# Patient Record
Sex: Male | Born: 1950 | Race: White | Hispanic: No | Smoking: Never smoker
Health system: Southern US, Community
[De-identification: ages and names within clinical notes are randomized; demographics above are authoritative.]

## PROBLEM LIST (undated history)

## (undated) DIAGNOSIS — K5100 Ulcerative (chronic) pancolitis without complications: Secondary | ICD-10-CM

## (undated) DIAGNOSIS — K529 Noninfective gastroenteritis and colitis, unspecified: Secondary | ICD-10-CM

## (undated) DIAGNOSIS — K2270 Barrett's esophagus without dysplasia: Secondary | ICD-10-CM

## (undated) DIAGNOSIS — K219 Gastro-esophageal reflux disease without esophagitis: Secondary | ICD-10-CM

## (undated) DIAGNOSIS — M1990 Unspecified osteoarthritis, unspecified site: Secondary | ICD-10-CM

## (undated) DIAGNOSIS — I10 Essential (primary) hypertension: Secondary | ICD-10-CM

## (undated) HISTORY — DX: Ulcerative (chronic) pancolitis without complications: K51.00

## (undated) HISTORY — DX: Noninfective gastroenteritis and colitis, unspecified: K52.9

## (undated) HISTORY — DX: Barrett's esophagus without dysplasia: K22.70

## (undated) HISTORY — DX: Unspecified osteoarthritis, unspecified site: M19.90

## (undated) HISTORY — DX: Gastro-esophageal reflux disease without esophagitis: K21.9

## (undated) HISTORY — DX: Essential (primary) hypertension: I10

---

## 2010-10-26 HISTORY — PX: HERNIA REPAIR: SHX51

## 2020-11-14 ENCOUNTER — Other Ambulatory Visit: Payer: Self-pay | Admitting: *Deleted

## 2020-11-14 ENCOUNTER — Telehealth: Payer: Self-pay

## 2020-11-14 NOTE — Telephone Encounter (Signed)
Patient is rescheduled

## 2020-11-14 NOTE — Telephone Encounter (Signed)
Richard Grant is calling in on behalf of her husband, they just moved here from [place] and have scheduled New Patient appointment's for March but are running to an issue with medication. They have tried calling their old doctor's office but because they are out of state they aren't able to get any refills. Richard Grant wants to know if there is any way they can get in before March.

## 2020-11-14 NOTE — Telephone Encounter (Signed)
Ok to change appointment to an early day if we have any openings  We unable  to refill medication till patient establish care with PCP

## 2020-12-05 ENCOUNTER — Encounter: Payer: Self-pay | Admitting: Family Medicine

## 2020-12-05 ENCOUNTER — Other Ambulatory Visit: Payer: Self-pay

## 2020-12-05 ENCOUNTER — Ambulatory Visit (INDEPENDENT_AMBULATORY_CARE_PROVIDER_SITE_OTHER): Payer: Medicare HMO | Admitting: Family Medicine

## 2020-12-05 VITALS — BP 122/74 | HR 87 | Temp 98.3°F | Ht 62.0 in | Wt 123.8 lb

## 2020-12-05 DIAGNOSIS — Z0001 Encounter for general adult medical examination with abnormal findings: Secondary | ICD-10-CM

## 2020-12-05 DIAGNOSIS — Z125 Encounter for screening for malignant neoplasm of prostate: Secondary | ICD-10-CM | POA: Diagnosis not present

## 2020-12-05 DIAGNOSIS — Z1211 Encounter for screening for malignant neoplasm of colon: Secondary | ICD-10-CM | POA: Diagnosis not present

## 2020-12-05 DIAGNOSIS — Z13220 Encounter for screening for lipoid disorders: Secondary | ICD-10-CM

## 2020-12-05 DIAGNOSIS — I10 Essential (primary) hypertension: Secondary | ICD-10-CM

## 2020-12-05 LAB — LIPID PANEL
Cholesterol: 183 mg/dL (ref 0–200)
HDL: 82.7 mg/dL (ref >39.00)
LDL Cholesterol: 84 mg/dL (ref 0–99)
NonHDL: 100.44
Total CHOL/HDL Ratio: 2
Triglycerides: 84 mg/dL (ref 0.0–149.0)
VLDL: 16.8 mg/dL (ref 0.0–40.0)

## 2020-12-05 LAB — COMPREHENSIVE METABOLIC PANEL
ALT: 12 U/L (ref 0–53)
AST: 16 U/L (ref 0–37)
Albumin: 4.1 g/dL (ref 3.5–5.2)
Alkaline Phosphatase: 65 U/L (ref 39–117)
BUN: 13 mg/dL (ref 6–23)
CO2: 30 mEq/L (ref 19–32)
Calcium: 9.6 mg/dL (ref 8.4–10.5)
Chloride: 101 mEq/L (ref 96–112)
Creatinine, Ser: 0.91 mg/dL (ref 0.40–1.50)
GFR: 87.36 mL/min (ref >60.00)
Glucose, Bld: 82 mg/dL (ref 70–99)
Potassium: 3.9 mEq/L (ref 3.5–5.1)
Sodium: 140 mEq/L (ref 135–145)
Total Bilirubin: 0.6 mg/dL (ref 0.2–1.2)
Total Protein: 7.3 g/dL (ref 6.0–8.3)

## 2020-12-05 LAB — CBC
HCT: 42 % (ref 39.0–52.0)
Hemoglobin: 14.3 g/dL (ref 13.0–17.0)
MCHC: 34 g/dL (ref 30.0–36.0)
MCV: 99 fl (ref 78.0–100.0)
Platelets: 405 10*3/uL — ABNORMAL HIGH (ref 150.0–400.0)
RBC: 4.24 Mil/uL (ref 4.22–5.81)
RDW: 13.6 % (ref 11.5–15.5)
WBC: 11.6 10*3/uL — ABNORMAL HIGH (ref 4.0–10.5)

## 2020-12-05 LAB — TSH: TSH: 3.89 u[IU]/mL (ref 0.35–4.50)

## 2020-12-05 LAB — PSA: PSA: 2.06 ng/mL (ref 0.10–4.00)

## 2020-12-05 NOTE — Progress Notes (Signed)
Chief Complaint:  Richard Grant is a 70 y.o. male who presents today for his annual comprehensive physical exam.    Assessment/Plan:  New/Acute Problems: Diarrhea Started a few weeks ago.  He is concerned about IBS.  He has been under more stress recently due to recent move.  No reported abdominal pain.  Reassuring abdominal exam today.  No reported melena or hematochezia.  Recommend he try peppermint oil.  He will let me know if not proving the next  Chronic Problems Addressed Today: Essential hypertension At goal.  Continue HCTZ 25 mg daily metoprolol 50 mg daily.  Obtain records from previous PCP.  Recommended home monitoring goal 140/90 or lower.  Discussed lifestyle interventions.  Check labs today.  Preventative Healthcare: Has received 1 pneumonia vaccine but is not sure which.  Will obtain records from previous PCP regarding his amount of healthcare and prior vaccines.  Will check labs today.  Also place order for Cologuard.  Patient Counseling(The following topics were reviewed and/or handout was given):  -Nutrition: Stressed importance of moderation in sodium/caffeine intake, saturated fat and cholesterol, caloric balance, sufficient intake of fresh fruits, vegetables, and fiber.  -Stressed the importance of regular exercise.   -Substance Abuse: Discussed cessation/primary prevention of tobacco, alcohol, or other drug use; driving or other dangerous activities under the influence; availability of treatment for abuse.   -Injury prevention: Discussed safety belts, safety helmets, smoke detector, smoking near bedding or upholstery.   -Sexuality: Discussed sexually transmitted diseases, partner selection, use of condoms, avoidance of unintended pregnancy and contraceptive alternatives.   -Dental health: Discussed importance of regular tooth brushing, flossing, and dental visits.  -Health maintenance and immunizations reviewed. Please refer to Health maintenance section.  Return to  care in 1 year for next preventative visit.     Subjective:  HPI:  He has no acute complaints today.   Lifestyle Diet: Balanced.  Exercise: Does a lot of walking.   Depression screen PHQ 2/9 12/05/2020  Decreased Interest 0  Down, Depressed, Hopeless 0  PHQ - 2 Score 0    Health Maintenance Due  Topic Date Due  . Hepatitis C Screening  Never done  . TETANUS/TDAP  Never done  . COLONOSCOPY (Pts 45-32yrs Insurance coverage will need to be confirmed)  Never done  . PNA vac Low Risk Adult (1 of 2 - PCV13) Never done     ROS: Per HPI, otherwise a complete review of systems was negative.   PMH:  The following were reviewed and entered/updated in epic: Past Medical History:  Diagnosis Date  . Arthritis   . Hypertension    Patient Active Problem List   Diagnosis Date Noted  . Essential hypertension 12/05/2020   Past Surgical History:  Procedure Laterality Date  . HERNIA REPAIR  2012    Family History  Problem Relation Age of Onset  . Arthritis Mother   . Hypertension Mother   . Diabetes Father   . Early death Father   . Hypertension Father   . Drug abuse Sister   . Mental illness Sister   . Cancer Maternal Grandfather   . Cancer Paternal Grandfather     Medications- reviewed and updated Current Outpatient Medications  Medication Sig Dispense Refill  . hydrochlorothiazide (HYDRODIURIL) 25 MG tablet Take 25 mg by mouth daily.    . metoprolol tartrate (LOPRESSOR) 50 MG tablet Take 50 mg by mouth daily at 8 pm.    . Milk Thistle 500 MG CAPS Take by mouth.    Marland Kitchen  MULTIPLE VITAMIN PO Take by mouth.    . Saw Palmetto, Serenoa repens, (SAW PALMETTO PO) Take by mouth.     No current facility-administered medications for this visit.    Allergies-reviewed and updated Allergies  Allergen Reactions  . Lactose Intolerance (Gi)   . Latex     Social History   Socioeconomic History  . Marital status: Married    Spouse name: Not on file  . Number of children: Not  on file  . Years of education: Not on file  . Highest education level: Not on file  Occupational History  . Not on file  Tobacco Use  . Smoking status: Never Smoker  . Smokeless tobacco: Not on file  Substance and Sexual Activity  . Alcohol use: Yes    Alcohol/week: 2.0 standard drinks    Types: 2 Glasses of wine per week    Comment: DAILY   . Drug use: Never  . Sexual activity: Not on file  Other Topics Concern  . Not on file  Social History Narrative  . Not on file   Social Determinants of Health   Financial Resource Strain: Not on file  Food Insecurity: Not on file  Transportation Needs: Not on file  Physical Activity: Not on file  Stress: Not on file  Social Connections: Not on file        Objective:  Physical Exam: BP 122/74   Pulse 87   Temp 98.3 F (36.8 C) (Oral)   Ht 5\' 2"  (1.575 m)   Wt 123 lb 12.8 oz (56.2 kg)   SpO2 99%   BMI 22.64 kg/m   Body mass index is 22.64 kg/m. Wt Readings from Last 3 Encounters:  12/05/20 123 lb 12.8 oz (56.2 kg)   Gen: NAD, resting comfortably HEENT: TMs normal bilaterally. OP clear. No thyromegaly noted.  CV: RRR with no murmurs appreciated Pulm: NWOB, CTAB with no crackles, wheezes, or rhonchi GI: Normal bowel sounds present. Soft, Nontender, Nondistended. MSK: no edema, cyanosis, or clubbing noted Skin: warm, dry Neuro: CN2-12 grossly intact. Strength 5/5 in upper and lower extremities. Reflexes symmetric and intact bilaterally.  Psych: Normal affect and thought content     Caleb M. [name], MD 12/05/2020 12:14 PM

## 2020-12-05 NOTE — Patient Instructions (Signed)
It was very nice to see you today!  We will check blood work today.  Please keep an eye on your blood pressure and let me know if it is persistently 140/90 or higher.  I will get you set up to have a Cologuard test done to screen for colon cancer.  I will see you back in year for your next checkup.  Please come back to see me sooner if needed.  Take care, Dr [name]  Please try these tips to maintain a healthy lifestyle:   Eat at least 3 REAL meals and 1-2 snacks per day.  Aim for no more than 5 hours between eating.  If you eat breakfast, please do so within one hour of getting up.    Each meal should contain half fruits/vegetables, one quarter protein, and one quarter carbs (no bigger than a computer mouse)   Cut down on sweet beverages. This includes juice, soda, and sweet tea.     Drink at least 1 glass of water with each meal and aim for at least 8 glasses per day   Exercise at least 150 minutes every week.    Preventive Care 68 Years and Older, Male Preventive care refers to lifestyle choices and visits with your health care provider that can promote health and wellness. This includes:  A yearly physical exam. This is also called an annual wellness visit.  Regular dental and eye exams.  Immunizations.  Screening for certain conditions.  Healthy lifestyle choices, such as: ? Eating a healthy diet. ? Getting regular exercise. ? Not using drugs or products that contain nicotine and tobacco. ? Limiting alcohol use. What can I expect for my preventive care visit? Physical exam Your health care provider will check your:  Height and weight. These may be used to calculate your BMI (body mass index). BMI is a measurement that tells if you are at a healthy weight.  Heart rate and blood pressure.  Body temperature.  Skin for abnormal spots. Counseling Your health care provider may ask you questions about your:  Past medical problems.  Family's medical  history.  Alcohol, tobacco, and drug use.  Emotional well-being.  Home life and relationship well-being.  Sexual activity.  Diet, exercise, and sleep habits.  History of falls.  Memory and ability to understand (cognition).  Work and work Statistician.  Access to firearms. What immunizations do I need? Vaccines are usually given at various ages, according to a schedule. Your health care provider will recommend vaccines for you based on your age, medical history, and lifestyle or other factors, such as travel or where you work.   What tests do I need? Blood tests  Lipid and cholesterol levels. These may be checked every 5 years, or more often depending on your overall health.  Hepatitis C test.  Hepatitis B test. Screening  Lung cancer screening. You may have this screening every year starting at age 68 if you have a 30-pack-year history of smoking and currently smoke or have quit within the past 15 years.  Colorectal cancer screening. ? All adults should have this screening starting at age 68 and continuing until age 70. ? Your health care provider may recommend screening at age 68 if you are at increased risk. ? You will have tests every 1-10 years, depending on your results and the type of screening test.  Prostate cancer screening. Recommendations will vary depending on your family history and other risks.  Genital exam to check for testicular cancer or  hernias.  Diabetes screening. ? This is done by checking your blood sugar (glucose) after you have not eaten for a while (fasting). ? You may have this done every 1-3 years.  Abdominal aortic aneurysm (AAA) screening. You may need this if you are a current or former smoker.  STD (sexually transmitted disease) testing, if you are at risk. Follow these instructions at home: Eating and drinking  Eat a diet that includes fresh fruits and vegetables, whole grains, lean protein, and low-fat dairy products. Limit your  intake of foods with high amounts of sugar, saturated fats, and salt.  Take vitamin and mineral supplements as recommended by your health care provider.  Do not drink alcohol if your health care provider tells you not to drink.  If you drink alcohol: ? Limit how much you have to 0-2 drinks a day. ? Be aware of how much alcohol is in your drink. In the [place] one drink equals one 12 oz bottle of beer (355 mL), one 5 oz glass of wine (148 mL), or one 1 oz glass of hard liquor (44 mL).   Lifestyle  Take daily care of your teeth and gums. Brush your teeth every morning and night with fluoride toothpaste. Floss one time each day.  Stay active. Exercise for at least 30 minutes 5 or more days each week.  Do not use any products that contain nicotine or tobacco, such as cigarettes, e-cigarettes, and chewing tobacco. If you need help quitting, ask your health care provider.  Do not use drugs.  If you are sexually active, practice safe sex. Use a condom or other form of protection to prevent STIs (sexually transmitted infections).  Talk with your health care provider about taking a low-dose aspirin or statin.  Find healthy ways to cope with stress, such as: ? Meditation, yoga, or listening to music. ? Journaling. ? Talking to a trusted person. ? Spending time with friends and family. Safety  Always wear your seat belt while driving or riding in a vehicle.  Do not drive: ? If you have been drinking alcohol. Do not ride with someone who has been drinking. ? When you are tired or distracted. ? While texting.  Wear a helmet and other protective equipment during sports activities.  If you have firearms in your house, make sure you follow all gun safety procedures. What's next?  Visit your health care provider once a year for an annual wellness visit.  Ask your health care provider how often you should have your eyes and teeth checked.  Stay up to date on all vaccines. This information  is not intended to replace advice given to you by your health care provider. Make sure you discuss any questions you have with your health care provider. Document Revised: 07/11/2019 Document Reviewed: 10/06/2018 Elsevier Patient Education  2021 [employer].

## 2020-12-05 NOTE — Assessment & Plan Note (Addendum)
At goal.  Continue HCTZ 25 mg daily metoprolol 50 mg daily.  Obtain records from previous PCP.  Recommended home monitoring goal 140/90 or lower.  Discussed lifestyle interventions.  Check labs today.

## 2020-12-06 ENCOUNTER — Other Ambulatory Visit: Payer: Self-pay

## 2020-12-06 DIAGNOSIS — D72829 Elevated white blood cell count, unspecified: Secondary | ICD-10-CM

## 2020-12-06 NOTE — Progress Notes (Signed)
Please inform patient of the following:  His white blood cell count is elevated but everything else is withing expected ranges. I would like for him to come back in a couple of weeks to recheck blood work to make sure his white blood cell count is coming back to normal. Please place future order for CBC with differential due to his leukocytosis.  Richard Grant. [name], MD 12/06/2020 9:14 AM

## 2020-12-20 ENCOUNTER — Other Ambulatory Visit (INDEPENDENT_AMBULATORY_CARE_PROVIDER_SITE_OTHER): Payer: Medicare HMO

## 2020-12-20 ENCOUNTER — Other Ambulatory Visit: Payer: Self-pay

## 2020-12-20 DIAGNOSIS — D72829 Elevated white blood cell count, unspecified: Secondary | ICD-10-CM

## 2020-12-20 LAB — CBC WITH DIFFERENTIAL/PLATELET
Basophils Absolute: 0 10*3/uL (ref 0.0–0.1)
Basophils Relative: 0.3 % (ref 0.0–3.0)
Eosinophils Absolute: 0.1 10*3/uL (ref 0.0–0.7)
Eosinophils Relative: 0.7 % (ref 0.0–5.0)
HCT: 43.6 % (ref 39.0–52.0)
Hemoglobin: 14.8 g/dL (ref 13.0–17.0)
Lymphocytes Relative: 12.1 % (ref 12.0–46.0)
Lymphs Abs: 1.4 10*3/uL (ref 0.7–4.0)
MCHC: 33.9 g/dL (ref 30.0–36.0)
MCV: 97.7 fl (ref 78.0–100.0)
Monocytes Absolute: 0.9 10*3/uL (ref 0.1–1.0)
Monocytes Relative: 7.9 % (ref 3.0–12.0)
Neutro Abs: 8.9 10*3/uL — ABNORMAL HIGH (ref 1.4–7.7)
Neutrophils Relative %: 79 % — ABNORMAL HIGH (ref 43.0–77.0)
Platelets: 357 10*3/uL (ref 150.0–400.0)
RBC: 4.47 Mil/uL (ref 4.22–5.81)
RDW: 13.8 % (ref 11.5–15.5)
WBC: 11.3 10*3/uL — ABNORMAL HIGH (ref 4.0–10.5)

## 2020-12-23 NOTE — Progress Notes (Signed)
Please inform patient of the following:  White blood cell count is stable but still elevated. Can we see if the lab can add on a peripheral smear?  If not we will have to have him come back for one more test.  Richard Grant. [name], MD 12/23/2020 11:17 AM

## 2020-12-24 ENCOUNTER — Other Ambulatory Visit: Payer: Self-pay

## 2020-12-31 ENCOUNTER — Other Ambulatory Visit (INDEPENDENT_AMBULATORY_CARE_PROVIDER_SITE_OTHER): Payer: Medicare HMO

## 2020-12-31 ENCOUNTER — Other Ambulatory Visit: Payer: Self-pay | Admitting: *Deleted

## 2020-12-31 ENCOUNTER — Other Ambulatory Visit: Payer: Self-pay

## 2020-12-31 DIAGNOSIS — D72829 Elevated white blood cell count, unspecified: Secondary | ICD-10-CM

## 2020-12-31 DIAGNOSIS — R899 Unspecified abnormal finding in specimens from other organs, systems and tissues: Secondary | ICD-10-CM | POA: Diagnosis not present

## 2020-12-31 DIAGNOSIS — D649 Anemia, unspecified: Secondary | ICD-10-CM | POA: Diagnosis not present

## 2020-12-31 LAB — CBC WITH DIFFERENTIAL/PLATELET
Basophils Absolute: 0 10*3/uL (ref 0.0–0.1)
Basophils Relative: 0.2 % (ref 0.0–3.0)
Eosinophils Absolute: 0.1 10*3/uL (ref 0.0–0.7)
Eosinophils Relative: 0.7 % (ref 0.0–5.0)
HCT: 41 % (ref 39.0–52.0)
Hemoglobin: 14 g/dL (ref 13.0–17.0)
Lymphocytes Relative: 11.9 % — ABNORMAL LOW (ref 12.0–46.0)
Lymphs Abs: 1.6 10*3/uL (ref 0.7–4.0)
MCHC: 34.1 g/dL (ref 30.0–36.0)
MCV: 97.9 fl (ref 78.0–100.0)
Monocytes Absolute: 0.9 10*3/uL (ref 0.1–1.0)
Monocytes Relative: 7 % (ref 3.0–12.0)
Neutro Abs: 10.8 10*3/uL — ABNORMAL HIGH (ref 1.4–7.7)
Neutrophils Relative %: 80.2 % — ABNORMAL HIGH (ref 43.0–77.0)
Platelets: 373 10*3/uL (ref 150.0–400.0)
RBC: 4.18 Mil/uL — ABNORMAL LOW (ref 4.22–5.81)
RDW: 14.6 % (ref 11.5–15.5)
WBC: 13.5 10*3/uL — ABNORMAL HIGH (ref 4.0–10.5)

## 2021-01-01 LAB — PATHOLOGIST SMEAR REVIEW

## 2021-01-06 NOTE — Progress Notes (Signed)
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 I dont not have any previous numbers to compare with but this is most likely all benign. We can recheck again in about a month to make sure his numbers are coming back down OR we can refer him to see a hematologist. Please place referral and/or future order if patient is interested.  Richard Grant. [name], MD 01/06/2021 10:14 AM

## 2021-01-07 ENCOUNTER — Other Ambulatory Visit: Payer: Self-pay | Admitting: *Deleted

## 2021-01-07 DIAGNOSIS — R899 Unspecified abnormal finding in specimens from other organs, systems and tissues: Secondary | ICD-10-CM

## 2021-01-13 ENCOUNTER — Ambulatory Visit: Payer: Medicare HMO | Admitting: Family Medicine

## 2021-02-06 ENCOUNTER — Other Ambulatory Visit: Payer: Self-pay

## 2021-02-06 ENCOUNTER — Other Ambulatory Visit (INDEPENDENT_AMBULATORY_CARE_PROVIDER_SITE_OTHER): Payer: Medicare HMO

## 2021-02-06 DIAGNOSIS — R899 Unspecified abnormal finding in specimens from other organs, systems and tissues: Secondary | ICD-10-CM

## 2021-02-06 DIAGNOSIS — D72829 Elevated white blood cell count, unspecified: Secondary | ICD-10-CM | POA: Diagnosis not present

## 2021-02-06 DIAGNOSIS — D649 Anemia, unspecified: Secondary | ICD-10-CM | POA: Diagnosis not present

## 2021-02-07 LAB — PATHOLOGIST SMEAR REVIEW

## 2021-02-10 ENCOUNTER — Other Ambulatory Visit: Payer: Medicare HMO

## 2021-02-10 NOTE — Progress Notes (Signed)
Please inform patient of the following:  His smear is stable compared to previous.    There was supposed to be a CBC done with this - can we check to see if one can be added on or if we need to have him come back for another blood draw.  Algis Greenhouse. [name], MD 02/10/2021 3:43 PM

## 2021-02-11 ENCOUNTER — Other Ambulatory Visit: Payer: Self-pay

## 2021-02-11 DIAGNOSIS — D72829 Elevated white blood cell count, unspecified: Secondary | ICD-10-CM

## 2021-02-11 DIAGNOSIS — R899 Unspecified abnormal finding in specimens from other organs, systems and tissues: Secondary | ICD-10-CM

## 2021-02-13 ENCOUNTER — Telehealth: Payer: Self-pay

## 2021-02-13 NOTE — Telephone Encounter (Signed)
Patient scheduled with Dr.Parker

## 2021-02-13 NOTE — Telephone Encounter (Signed)
Richard Grant is calling in wondering if Dr.Parker could give Roben a call and explain everything that is going on, as he is confused as to why he is having so much blood work done and Dr.Parker hasnt talked to him. Offered an appointment with Dr.Parker, but declined stating they dont want to wait until Tuesday, then offered a virtual call and they declined. Please advise.

## 2021-02-14 ENCOUNTER — Other Ambulatory Visit: Payer: Self-pay

## 2021-02-14 ENCOUNTER — Other Ambulatory Visit (INDEPENDENT_AMBULATORY_CARE_PROVIDER_SITE_OTHER): Payer: Medicare HMO

## 2021-02-14 DIAGNOSIS — D72829 Elevated white blood cell count, unspecified: Secondary | ICD-10-CM | POA: Diagnosis not present

## 2021-02-14 DIAGNOSIS — R899 Unspecified abnormal finding in specimens from other organs, systems and tissues: Secondary | ICD-10-CM | POA: Diagnosis not present

## 2021-02-14 LAB — CBC
HCT: 40.7 % (ref 39.0–52.0)
Hemoglobin: 14 g/dL (ref 13.0–17.0)
MCHC: 34.4 g/dL (ref 30.0–36.0)
MCV: 99.6 fl (ref 78.0–100.0)
Platelets: 368 10*3/uL (ref 150.0–400.0)
RBC: 4.09 Mil/uL — ABNORMAL LOW (ref 4.22–5.81)
RDW: 14.9 % (ref 11.5–15.5)
WBC: 11.6 10*3/uL — ABNORMAL HIGH (ref 4.0–10.5)

## 2021-02-14 NOTE — Progress Notes (Signed)
Please inform patient of the following:  Blood counts are stable. We can discuss more at his upcoming appointment.  Richard Grant. [name], MD 02/14/2021 3:50 PM

## 2021-02-17 ENCOUNTER — Other Ambulatory Visit: Payer: Self-pay

## 2021-02-17 ENCOUNTER — Encounter: Payer: Self-pay | Admitting: Family Medicine

## 2021-02-17 ENCOUNTER — Ambulatory Visit (INDEPENDENT_AMBULATORY_CARE_PROVIDER_SITE_OTHER): Payer: Medicare HMO | Admitting: Family Medicine

## 2021-02-17 VITALS — BP 137/80 | HR 89 | Temp 98.4°F | Ht 62.0 in | Wt 125.0 lb

## 2021-02-17 DIAGNOSIS — R197 Diarrhea, unspecified: Secondary | ICD-10-CM

## 2021-02-17 DIAGNOSIS — Z23 Encounter for immunization: Secondary | ICD-10-CM | POA: Diagnosis not present

## 2021-02-17 DIAGNOSIS — I10 Essential (primary) hypertension: Secondary | ICD-10-CM

## 2021-02-17 DIAGNOSIS — D729 Disorder of white blood cells, unspecified: Secondary | ICD-10-CM

## 2021-02-17 NOTE — Progress Notes (Signed)
   Richard Grant is a 70 y.o. male who presents today for an office visit.  Assessment/Plan:  Chronic Problems Addressed Today: Diarrhea No red flags however patient is due for colon cancer screening and given the persistent nature of diarrhea will place referral to GI.  He has been using imodium.   Neutrophilia WBC slightly elevated at 11.6 with neutrophilia.  Discussed management options.  We will continue with watchful waiting for now given his stable numbers, reassuring peripheral smear, and lack of other red flag signs or symptoms.  His diarrhea could be contributing - will manage as above.  We will recheck CBC in about 6 months.  Essential hypertension At goal.  Continue HCTZ 25 mg daily and metoprolol tartrate 50 mg daily.  Preventative health care Tdap given today.  Discussed pneumonia vaccine.  Currently has had 1 already a few years ago he had a bad reaction to this.  Advised patient he would be due for Prevnar 20 next.    Subjective:  HPI:  Patient here for follow-up.  Last seen a few months ago. Noted to have elevated white blood cell count.  Subsequent testing showed persistent neutrophilia.  He has had 2 peripheral smears which showed mature neutrophils with reactive changes.  HE has also had persistent diarrhea.  This has been going on for months.       Objective:  Physical Exam: BP 137/80   Pulse 89   Temp 98.4 F (36.9 C) (Temporal)   Ht 5\' 2"  (1.575 m)   Wt 125 lb (56.7 kg)   SpO2 99%   BMI 22.86 kg/m   Wt Readings from Last 3 Encounters:  02/17/21 125 lb (56.7 kg)  12/05/20 123 lb 12.8 oz (56.2 kg)  Gen: No acute distress, resting comfortably  Neuro: Grossly normal, moves all extremities Psych: Normal affect and thought content      Richard Grant M. [name], MD 02/17/2021 12:06 PM

## 2021-02-17 NOTE — Patient Instructions (Addendum)
It was very nice to see you today!  Your labs have stable mildly elevated white blood cell count.  This is benign.  It could be related to your diarrhea.  We should recheck in about 6 months or so.  Please schedule a lab appointment today.  I will place a referral to a gastroenterologist for your diarrhea.  We will give your tetanus vaccine today.  When you get your new pneumonia vaccine please make sure that this is Prevnar 20.  Take care, Dr [name]  PLEASE NOTE:  If you had any lab tests please let us know if you have not heard back within a few days. You may see your results on mychart before we have a chance to review them but we will give you a call once they are reviewed by [place]. If we ordered any referrals today, please let us know if you have not heard from their office within the next week.   Please try these tips to maintain a healthy lifestyle:   Eat at least 3 REAL meals and 1-2 snacks per day.  Aim for no more than 5 hours between eating.  If you eat breakfast, please do so within one hour of getting up.    Each meal should contain half fruits/vegetables, one quarter protein, and one quarter carbs (no bigger than a computer mouse)   Cut down on sweet beverages. This includes juice, soda, and sweet tea.     Drink at least 1 glass of water with each meal and aim for at least 8 glasses per day   Exercise at least 150 minutes every week.

## 2021-02-17 NOTE — Assessment & Plan Note (Addendum)
WBC slightly elevated at 11.6 with neutrophilia.  Discussed management options.  We will continue with watchful waiting for now given his stable numbers, reassuring peripheral smear, and lack of other red flag signs or symptoms.  His diarrhea could be contributing - will manage as above.  We will recheck CBC in about 6 months.

## 2021-02-17 NOTE — Assessment & Plan Note (Signed)
No red flags however patient is due for colon cancer screening and given the persistent nature of diarrhea will place referral to GI.  He has been using imodium.

## 2021-02-17 NOTE — Assessment & Plan Note (Signed)
At goal.  Continue HCTZ 25 mg daily and metoprolol tartrate 50 mg daily.

## 2021-02-27 ENCOUNTER — Encounter: Payer: Self-pay | Admitting: Gastroenterology

## 2021-03-19 ENCOUNTER — Other Ambulatory Visit: Payer: Self-pay

## 2021-03-19 ENCOUNTER — Encounter: Payer: Self-pay | Admitting: Gastroenterology

## 2021-03-19 ENCOUNTER — Encounter: Payer: Self-pay | Admitting: Internal Medicine

## 2021-03-19 ENCOUNTER — Ambulatory Visit: Payer: Medicare HMO | Admitting: Gastroenterology

## 2021-03-19 VITALS — BP 122/62 | HR 88 | Ht 62.0 in | Wt 123.4 lb

## 2021-03-19 DIAGNOSIS — R634 Abnormal weight loss: Secondary | ICD-10-CM | POA: Diagnosis not present

## 2021-03-19 DIAGNOSIS — K219 Gastro-esophageal reflux disease without esophagitis: Secondary | ICD-10-CM | POA: Diagnosis not present

## 2021-03-19 DIAGNOSIS — K529 Noninfective gastroenteritis and colitis, unspecified: Secondary | ICD-10-CM | POA: Diagnosis not present

## 2021-03-19 MED ORDER — NA SULFATE-K SULFATE-MG SULF 17.5-3.13-1.6 GM/177ML PO SOLN: 1.0000 | Freq: Once | ORAL | 0 refills | Status: AC

## 2021-03-19 NOTE — Patient Instructions (Addendum)
If you are age 68 or older, your body mass index should be between 23-30. Your Body mass index is 22.57 kg/m. If this is out of the aforementioned range listed, please consider follow up with your Primary Care Provider.  If you are age 66 or younger, your body mass index should be between 19-25. Your Body mass index is 22.57 kg/m. If this is out of the aformentioned range listed, please consider follow up with your Primary Care Provider.   You have been scheduled for a colonoscopy. Please follow written instructions given to you at your visit today.  Please pick up your prep supplies at the pharmacy within the next 1-3 days. If you use inhalers (even only as needed), please bring them with you on the day of your procedure.  Due to recent changes in healthcare laws, you may see the results of your imaging and laboratory studies on MyChart before your provider has had a chance to review them.  We understand that in some cases there may be results that are confusing or concerning to you. Not all laboratory results come back in the same time frame and the provider may be waiting for multiple results in order to interpret others.  Please give [place] 48 hours in order for your provider to thoroughly review all the results before contacting the office for clarification of your results.   [facility] providers would like to encourage you to use [facility] to communicate with providers for non-urgent requests or questions.  Due to long hold times on the telephone, sending your provider a message by [facility] may be a faster and more efficient way to get a response.  Please allow 48 business hours for a response.  Please remember that this is for non-urgent requests.

## 2021-03-19 NOTE — Progress Notes (Signed)
03/19/2021 Richard Grant [id number] 14-Jan-1951   HISTORY OF PRESENT ILLNESS: This is a pleasant 70 year old male who is new to our office.  He has been referred here by Dr. [name] for evaluation of diarrhea.  He has limited past medical history.  He just relocated to [place] from [place] after he retired.  They just moved here in January.  The patient tells me that he has a history of intermittent diarrhea that he thought probably was related to certain foods that he ate, etc.  He believes that he probably has lactose intolerance and possibly irritable bowel syndrome.  He says that this diarrhea that he is having now has been persistent for the past 5 months, however.  He says it began around the time that he was relocating to [place] so he thought that was probably stress related, but it has not gotten better.  He is having 3-4 watery/loose bowel movements a day.  Occasionally wakes up in the middle the night with diarrhea.  He denies any blood in his stool.  Has occasional lower abdominal cramping.  He has not had any antibiotic use recently.  He has never had colonoscopy in the past.  He does use Imodium if needed when he is going out about.  That does work for him.  He reports about a 10 pound weight loss over the past 9 to 10 months.  Recent TSH was normal.  CBC shows a normal hemoglobin at 14 g.  CMP normal.  While he is here he also admits to "heartburn" on a regular basis for which he uses Rolaids, etc.  He does not take anything else regularly to prevent his symptoms.  He denies any dysphagia.   Past Medical History:  Diagnosis Date  . Arthritis   . Hypertension    Past Surgical History:  Procedure Laterality Date  . HERNIA REPAIR  2012    reports that he has never smoked. He has never used smokeless tobacco. He reports current alcohol use of about 3.0 standard drinks of alcohol per week. He reports that he does not use drugs. family history includes Arthritis in his  mother; Diabetes in his father; Drug abuse in his sister; Early death in his father; Hypertension in his father and mother; Leukemia in his maternal grandfather; Liver cancer in his paternal grandfather; Mental illness in his sister. Allergies  Allergen Reactions  . Lactose Intolerance (Gi)   . Latex       Outpatient Encounter Medications as of 03/19/2021  Medication Sig  . hydrochlorothiazide (HYDRODIURIL) 25 MG tablet Take 25 mg by mouth daily.  . metoprolol tartrate (LOPRESSOR) 50 MG tablet Take 50 mg by mouth daily at 8 pm.  . Milk Thistle 500 MG CAPS Take 1 capsule by mouth daily.  . MULTIPLE VITAMIN PO Take 1 capsule by mouth daily.  . Na Sulfate-K Sulfate-Mg Sulf 17.5-3.13-1.6 GM/177ML SOLN Take 1 kit by mouth once for 1 dose.  . Saw Palmetto, Serenoa repens, (SAW PALMETTO PO) Take 1 capsule by mouth daily.   No facility-administered encounter medications on file as of 03/19/2021.     REVIEW OF SYSTEMS  : All other systems reviewed and negative except where noted in the History of Present Illness.   PHYSICAL EXAM: BP 122/62   Pulse 88   Ht 5' 2"  (1.575 m)   Wt 123 lb 6.4 oz (56 kg)   BMI 22.57 kg/m  General: Well developed white male in no acute distress Head: Normocephalic  and atraumatic Eyes:  Sclerae anicteric, conjunctiva pink. Ears: Normal auditory acuity  Lungs: Clear throughout to auscultation; no W/R/R. Heart: Regular rate and rhythm; no M/R/G. Abdomen: Soft, non-distended.  BS present.  Non-tender.  Abdomen very tense, patient had a hard time relaxing. Rectal:  Will be done at the time of colonoscopy. Musculoskeletal: Symmetrical with no gross deformities  Skin: No lesions on visible extremities Extremities: No edema  Neurological: Alert oriented x 4, grossly non-focal Psychological:  Alert and cooperative. Normal mood and affect  ASSESSMENT AND PLAN: *Chronic diarrhea: Patient reports diarrhea/loose and watery stools 3-4 times a day consistently for the  past 5 months.  Has history of intermittent diarrhea and suspects that he has some lactose intolerance and irritable bowel syndrome, but now he has not had a solid stool in the past several months.  Has not been on any antibiotics.  This began around the time that they moved from [place] to [place] and he thought that it was stress related, but has not resolved.  Very well could be irritable bowel as he is an anxious person, but he has never had colonoscopy in the past.  We will plan for colonoscopy with Dr. Carlean Purl to rule out IBD, microscopic colitis, etc.  Recent TSH is normal.  If colonoscopy is normal question if we should perform pancreatic fecal elastase to rule out EPI.  He uses Imodium as needed and can continue to do so for now. *GERD: He describes having heartburn several days a week for which he takes Tums with improvement in his symptoms.  Has never had an EGD in the past.  Does not take any other acid reflux medications regularly.  No dysphagia.  We will plan for EGD with Dr. Carlean Purl as well.  Will await results before placing him on daily PPI therapy. *Weight loss: He describes about a 10 pound weight loss over the past several months.  **The risks, benefits, and alternatives to EGD and colonoscopy were discussed with the patient and he consents to proceed.  CC:  Vivi Barrack, MD

## 2021-03-26 ENCOUNTER — Other Ambulatory Visit: Payer: Self-pay

## 2021-03-26 ENCOUNTER — Ambulatory Visit (AMBULATORY_SURGERY_CENTER): Payer: Medicare HMO | Admitting: Internal Medicine

## 2021-03-26 ENCOUNTER — Encounter: Payer: Self-pay | Admitting: Internal Medicine

## 2021-03-26 VITALS — BP 134/81 | HR 100 | Temp 97.7°F | Resp 19 | Ht 62.0 in | Wt 123.0 lb

## 2021-03-26 DIAGNOSIS — K449 Diaphragmatic hernia without obstruction or gangrene: Secondary | ICD-10-CM

## 2021-03-26 DIAGNOSIS — K31A0 Gastric intestinal metaplasia, unspecified: Secondary | ICD-10-CM | POA: Diagnosis not present

## 2021-03-26 DIAGNOSIS — K5140 Inflammatory polyps of colon without complications: Secondary | ICD-10-CM | POA: Diagnosis not present

## 2021-03-26 DIAGNOSIS — K2970 Gastritis, unspecified, without bleeding: Secondary | ICD-10-CM | POA: Diagnosis not present

## 2021-03-26 DIAGNOSIS — R12 Heartburn: Secondary | ICD-10-CM

## 2021-03-26 DIAGNOSIS — K2950 Unspecified chronic gastritis without bleeding: Secondary | ICD-10-CM | POA: Diagnosis not present

## 2021-03-26 DIAGNOSIS — K2270 Barrett's esophagus without dysplasia: Secondary | ICD-10-CM | POA: Diagnosis not present

## 2021-03-26 DIAGNOSIS — K529 Noninfective gastroenteritis and colitis, unspecified: Secondary | ICD-10-CM | POA: Diagnosis not present

## 2021-03-26 DIAGNOSIS — K621 Rectal polyp: Secondary | ICD-10-CM | POA: Diagnosis not present

## 2021-03-26 DIAGNOSIS — K219 Gastro-esophageal reflux disease without esophagitis: Secondary | ICD-10-CM | POA: Diagnosis not present

## 2021-03-26 DIAGNOSIS — I10 Essential (primary) hypertension: Secondary | ICD-10-CM | POA: Diagnosis not present

## 2021-03-26 DIAGNOSIS — D128 Benign neoplasm of rectum: Secondary | ICD-10-CM

## 2021-03-26 DIAGNOSIS — R197 Diarrhea, unspecified: Secondary | ICD-10-CM | POA: Diagnosis not present

## 2021-03-26 HISTORY — PX: COLONOSCOPY: SHX174

## 2021-03-26 HISTORY — PX: ESOPHAGOGASTRODUODENOSCOPY: SHX1529

## 2021-03-26 MED ORDER — SODIUM CHLORIDE 0.9 % IV SOLN
500.0000 mL | Freq: Once | INTRAVENOUS | Status: DC
Start: 2021-03-26 — End: 2021-06-17

## 2021-03-26 MED ORDER — PANTOPRAZOLE SODIUM 20 MG PO TBEC
20.0000 mg | DELAYED_RELEASE_TABLET | Freq: Every day | ORAL | 3 refills | Status: DC
Start: 2021-03-26 — End: 2022-03-04

## 2021-03-26 NOTE — Op Note (Signed)
[facility] Patient Name: Richard Grant Procedure Date: 03/26/2021 7:53 AM MRN: [id number] Endoscopist: Gatha Mayer , MD Age: 70 Referring MD:  Date of Birth: 10/15/1951 Gender: Male Account #: [id number] Procedure:                Upper GI endoscopy Indications:              Heartburn, Suspected reflux esophagitis Medicines:                Propofol per Anesthesia, Monitored Anesthesia Care Procedure:                Pre-Anesthesia Assessment:                           - Prior to the procedure, a History and Physical                            was performed, and patient medications and                            allergies were reviewed. The patient's tolerance of                            previous anesthesia was also reviewed. The risks                            and benefits of the procedure and the sedation                            options and risks were discussed with the patient.                            All questions were answered, and informed consent                            was obtained. Prior Anticoagulants: The patient has                            taken no previous anticoagulant or antiplatelet                            agents. ASA Grade Assessment: II - A patient with                            mild systemic disease. After reviewing the risks                            and benefits, the patient was deemed in                            satisfactory condition to undergo the procedure.                           After obtaining informed consent, the endoscope was  passed under direct vision. Throughout the                            procedure, the patient's blood pressure, pulse, and                            oxygen saturations were monitored continuously. The                            Endoscope was introduced through the mouth, and                            advanced to the second part of duodenum. The upper                             GI endoscopy was accomplished without difficulty.                            The patient tolerated the procedure well. Scope In: Scope Out: Findings:                 There were esophageal mucosal changes consistent                            with short-segment Barrett's esophagus present in                            the distal esophagus. The maximum longitudinal                            extent of these mucosal changes was 3 cm in length.                            Mucosa was biopsied with a cold forceps for                            histology in 4 quadrants at intervals of 2 cm from                            32 to 35 cm from the incisors. A total of 2                            specimen bottles were sent to pathology.                            Verification of patient identification for the                            specimen was done. Estimated blood loss was minimal.                           A 5 cm hiatal hernia was present.  Patchy mildly erythematous mucosa without bleeding                            was found in the gastric body and in the gastric                            antrum. Biopsies were taken with a cold forceps for                            histology. Verification of patient identification                            for the specimen was done. Estimated blood loss was                            minimal.                           The gastroesophageal flap valve was visualized                            endoscopically and classified as Hill Grade IV (no                            fold, wide open lumen, hiatal hernia present).                           The exam was otherwise without abnormality.                           Biopsies for histology were taken with a cold                            forceps in the entire duodenum for evaluation of                            celiac disease. Verification of patient                             identification for the specimen was done. Estimated                            blood loss was minimal. Complications:            No immediate complications. Estimated Blood Loss:     Estimated blood loss was minimal. Impression:               - Esophageal mucosal changes consistent with                            short-segment Barrett's esophagus. Biopsied.                           - 5 cm hiatal hernia.                           -  Erythematous mucosa in the gastric body and                            antrum. Biopsied.                           - Gastroesophageal flap valve classified as Hill                            Grade IV (no fold, wide open lumen, hiatal hernia                            present).                           - The examination was otherwise normal.                           - Biopsies were taken with a cold forceps for                            evaluation of celiac disease. Recommendation:           - Patient has a contact number available for                            emergencies. The signs and symptoms of potential                            delayed complications were discussed with the                            patient. Return to normal activities tomorrow.                            Written discharge instructions were provided to the                            patient.                           - Resume previous diet.                           - Continue present medications.                           - Await pathology results.                           - See the other procedure note for documentation of                            additional recommendations. Gatha Mayer, MD 03/26/2021 8:48:37 AM This report has been signed electronically.

## 2021-03-26 NOTE — Progress Notes (Signed)
0756 Robinul 0.1 mg IV given due large amount of secretions upon assessment.  MD made aware, vss 

## 2021-03-26 NOTE — Progress Notes (Signed)
Vitals by CW 

## 2021-03-26 NOTE — Patient Instructions (Addendum)
There were several findings today:  #1 you have changes of a reflux related condition called Barrett's esophagus I think.  Biopsies taken.  #2 there is a hiatal hernia  #3 mild stomach inflammation called gastritis is suspected, biopsies taken  #4 there is inflammation in the colon and the end of the small intestine that I suspect is something called ulcerative colitis, biopsies taken  #5 there was a tiny polyp removed from the rectum that looks inflammatory as opposed to a precancerous polyp, pathology pending  My plan is the following:  #1 start pantoprazole 20 mg daily for heartburn, reflux and the Barrett's esophagus I suspect  # 2 await all the biopsies for further recommendations, plans and follow-up  I appreciate the opportunity to care for you. Gatha Mayer, MD, [facility]  Handouts given:  Colitis, barretts, polyps, Hiatal hernia Resume previous diet Continue current medications Await pathology results Start pantoprazole 40 mg daily   OU HAD AN ENDOSCOPIC PROCEDURE TODAY AT [facility]:   Refer to the procedure report that was given to you for any specific questions about what was found during the examination.  If the procedure report does not answer your questions, please call your gastroenterologist to clarify.  If you requested that your care partner not be given the details of your procedure findings, then the procedure report has been included in a sealed envelope for you to review at your convenience later.  YOU SHOULD EXPECT: Some feelings of bloating in the abdomen. Passage of more gas than usual.  Walking can help get rid of the air that was put into your GI tract during the procedure and reduce the bloating. If you had a lower endoscopy (such as a colonoscopy or flexible sigmoidoscopy) you may notice spotting of blood in your stool or on the toilet paper. If you underwent a bowel prep for your procedure, you may not have a normal bowel movement for a few  days.  Please Note:  You might notice some irritation and congestion in your nose or some drainage.  This is from the oxygen used during your procedure.  There is no need for concern and it should clear up in a day or so.  SYMPTOMS TO REPORT IMMEDIATELY:   Following lower endoscopy (colonoscopy or flexible sigmoidoscopy):  Excessive amounts of blood in the stool  Significant tenderness or worsening of abdominal pains  Swelling of the abdomen that is new, acute  Fever of 100F or higher   Following upper endoscopy (EGD)  Vomiting of blood or coffee ground material  New chest pain or pain under the shoulder blades  Painful or persistently difficult swallowing  New shortness of breath  Fever of 100F or higher  Black, tarry-looking stools  For urgent or emergent issues, a gastroenterologist can be reached at any hour by calling [phone]. Do not use MyChart messaging for urgent concerns.   DIET:  We do recommend a small meal at first, but then you may proceed to your regular diet.  Drink plenty of fluids but you should avoid alcoholic beverages for 24 hours.  ACTIVITY:  You should plan to take it easy for the rest of today and you should NOT DRIVE or use heavy machinery until tomorrow (because of the sedation medicines used during the test).    FOLLOW UP: Our staff will call the number listed on your records 48-72 hours following your procedure to check on you and address any questions or concerns that you may  have regarding the information given to you following your procedure. If we do not reach you, we will leave a message.  We will attempt to reach you two times.  During this call, we will ask if you have developed any symptoms of COVID 19. If you develop any symptoms (ie: fever, flu-like symptoms, shortness of breath, cough etc.) before then, please call [phone].  If you test positive for Covid 19 in the 2 weeks post procedure, please call and report this information to [place].     If any biopsies were taken you will be contacted by phone or by letter within the next 1-3 weeks.  Please call us at [phone] if you have not heard about the biopsies in 3 weeks.   SIGNATURES/CONFIDENTIALITY: You and/or your care partner have signed paperwork which will be entered into your electronic medical record.  These signatures attest to the fact that that the information above on your After Visit Summary has been reviewed and is understood.  Full responsibility of the confidentiality of this discharge information lies with you and/or your care-partner.

## 2021-03-26 NOTE — Op Note (Signed)
[facility] Patient Name: Richard Grant Procedure Date: 03/26/2021 7:53 AM MRN: [id number] Endoscopist: Gatha Mayer , MD Age: 70 Referring MD:  Date of Birth: 1950/10/29 Gender: Male Account #: [id number] Procedure:                Colonoscopy Indications:              Chronic diarrhea, Clinically significant diarrhea                            of unexplained origin Medicines:                Propofol per Anesthesia, Monitored Anesthesia Care Procedure:                Pre-Anesthesia Assessment:                           - Prior to the procedure, a History and Physical                            was performed, and patient medications and                            allergies were reviewed. The patient's tolerance of                            previous anesthesia was also reviewed. The risks                            and benefits of the procedure and the sedation                            options and risks were discussed with the patient.                            All questions were answered, and informed consent                            was obtained. Prior Anticoagulants: The patient has                            taken no previous anticoagulant or antiplatelet                            agents. ASA Grade Assessment: II - A patient with                            mild systemic disease. After reviewing the risks                            and benefits, the patient was deemed in                            satisfactory condition to undergo the procedure.  After obtaining informed consent, the colonoscope                            was passed under direct vision. Throughout the                            procedure, the patient's blood pressure, pulse, and                            oxygen saturations were monitored continuously. The                            Olympus CF-HQ190L (Serial# [id number]) Colonoscope was                            introduced through  the anus and advanced to the the                            terminal ileum, with identification of the                            appendiceal orifice and IC valve. The colonoscopy                            was performed without difficulty. The patient                            tolerated the procedure well. The quality of the                            bowel preparation was excellent. The terminal                            ileum, ileocecal valve, appendiceal orifice, and                            rectum were photographed. The bowel preparation                            used was SUPREP via split dose instruction. Scope In: 8:23:18 AM Scope Out: 8:36:07 AM Scope Withdrawal Time: 0 hours 10 minutes 28 seconds  Total Procedure Duration: 0 hours 12 minutes 49 seconds  Findings:                 The perianal exam findings include non-thrombosed                            external hemorrhoids.                           The digital rectal exam was normal. Pertinent                            negatives include normal prostate (size, shape, and  consistency).                           A patchy area of mucosa in the terminal ileum was                            mildly erythematous and granular. Biopsies were                            taken with a cold forceps for histology.                            Verification of patient identification for the                            specimen was done. Estimated blood loss was minimal.                           A diffuse area of mildly erythematous, friable                            (with contact bleeding), granular and                            vascular-pattern-decreased mucosa was found in the                            entire colon. Biopsies were taken with a cold                            forceps for histology. Verification of patient                            identification for the specimen was done. Estimated                             blood loss was minimal.                           A diminutive polyp was found in the rectum. The                            polyp was sessile. The polyp was removed with a                            cold snare. Resection and retrieval were complete.                            Verification of patient identification for the                            specimen was done. Estimated blood loss was minimal.  The exam was otherwise without abnormality on                            direct and retroflexion views. Complications:            No immediate complications. Estimated Blood Loss:     Estimated blood loss was minimal. Impression:               - Non-thrombosed external hemorrhoids found on                            perianal exam.                           - Erythematous and granular mucosa in the terminal                            ileum. Biopsied. ? backwash ileitis vs Crohn's                           - Erythematous, friable (with contact bleeding),                            granular and vascular-pattern-decreased mucosa in                            the entire examined colon. Biopsied. Looks like                            ulcerative colitis                           - One diminutive polyp in the rectum, removed with                            a cold snare. Resected and retrieved. looks                            inflammatory                           - The examination was otherwise normal on direct                            and retroflexion views. Recommendation:           - Patient has a contact number available for                            emergencies. The signs and symptoms of potential                            delayed complications were discussed with the                            patient. Return to normal activities tomorrow.  Written discharge instructions were provided to the                             patient.                           - Resume previous diet.                           - Continue present medications.                           - Await pathology results. Will need Tx - Apriso is                            mesalamine on formulary, also sulfasalazine -                            likely to try one of those but will depend upon                            additional work-up of small bowel, if necessary                           - See the other procedure note for documentation of                            additional recommendations. Gatha Mayer, MD 03/26/2021 8:54:26 AM This report has been signed electronically.

## 2021-03-26 NOTE — Progress Notes (Signed)
Called to room to assist during endoscopic procedure.  Patient ID and intended procedure confirmed with present staff. Received instructions for my participation in the procedure from the performing physician.  

## 2021-03-26 NOTE — Progress Notes (Signed)
Report given to PACU, vss 

## 2021-03-28 ENCOUNTER — Telehealth: Payer: Self-pay

## 2021-03-28 NOTE — Telephone Encounter (Signed)
Patient calling back stating he is doing well after procedure.

## 2021-03-28 NOTE — Telephone Encounter (Signed)
LVM

## 2021-03-28 NOTE — Telephone Encounter (Signed)
Left message on follow up call. 

## 2021-04-09 ENCOUNTER — Other Ambulatory Visit: Payer: Self-pay | Admitting: Internal Medicine

## 2021-04-09 DIAGNOSIS — K529 Noninfective gastroenteritis and colitis, unspecified: Secondary | ICD-10-CM

## 2021-04-09 DIAGNOSIS — K5190 Ulcerative colitis, unspecified, without complications: Secondary | ICD-10-CM | POA: Insufficient documentation

## 2021-04-09 DIAGNOSIS — K2270 Barrett's esophagus without dysplasia: Secondary | ICD-10-CM

## 2021-04-09 DIAGNOSIS — K51018 Ulcerative (chronic) pancolitis with other complication: Secondary | ICD-10-CM

## 2021-04-09 MED ORDER — MESALAMINE ER 0.375 G PO CP24: 1500.0000 mg | ORAL_CAPSULE | Freq: Every day | ORAL | 3 refills | Status: DC

## 2021-04-09 NOTE — Progress Notes (Signed)
The pt has been scheduled for an office visit on 8/23 at 63 Recalls entered

## 2021-04-09 NOTE — Progress Notes (Signed)
8/23 at 830 am appt made and recalls entered  Sent a My Chart message to the pt with the appt information.

## 2021-05-30 ENCOUNTER — Ambulatory Visit (INDEPENDENT_AMBULATORY_CARE_PROVIDER_SITE_OTHER): Payer: Medicare HMO

## 2021-05-30 DIAGNOSIS — Z0000 Encounter for general adult medical examination without abnormal findings: Secondary | ICD-10-CM

## 2021-05-30 NOTE — Patient Instructions (Signed)
Richard Grant , Thank you for taking time to come for your Medicare Wellness Visit. I appreciate your ongoing commitment to your health goals. Please review the following plan we discussed and let me know if I can assist you in the future.   Screening recommendations/referrals: Colonoscopy: Done 03/26/21 repeat in 1 year 03/26/22 Recommended yearly ophthalmology/optometry visit for glaucoma screening and checkup Recommended yearly dental visit for hygiene and checkup  Vaccinations: Influenza vaccine: Due in season  Pneumococcal vaccine: completed 04/05/21 Tdap vaccine: Done 02/17/21 repeat in 10 years  Shingles vaccine: Shingrix discussed. Please contact your pharmacy for coverage information.    Covid-19: Completed 3/17, 4/15, & 08/28/20, 02/12/21  Advanced directives: Please bring a copy of your health care power of attorney and living will to the office at your convenience.  Conditions/risks identified: Stay healthy and alive   Next appointment: Follow up in one year for your annual wellness visit.   Preventive Care 20 Years and Older, Male Preventive care refers to lifestyle choices and visits with your health care provider that can promote health and wellness. What does preventive care include? A yearly physical exam. This is also called an annual well check. Dental exams once or twice a year. Routine eye exams. Ask your health care provider how often you should have your eyes checked. Personal lifestyle choices, including: Daily care of your teeth and gums. Regular physical activity. Eating a healthy diet. Avoiding tobacco and drug use. Limiting alcohol use. Practicing safe sex. Taking low doses of aspirin every day. Taking vitamin and mineral supplements as recommended by your health care provider. What happens during an annual well check? The services and screenings done by your health care provider during your annual well check will depend on your age, overall health, lifestyle risk  factors, and family history of disease. Counseling  Your health care provider may ask you questions about your: Alcohol use. Tobacco use. Drug use. Emotional well-being. Home and relationship well-being. Sexual activity. Eating habits. History of falls. Memory and ability to understand (cognition). Work and work Statistician. Screening  You may have the following tests or measurements: Height, weight, and BMI. Blood pressure. Lipid and cholesterol levels. These may be checked every 5 years, or more frequently if you are over 33 years old. Skin check. Lung cancer screening. You may have this screening every year starting at age 38 if you have a 30-pack-year history of smoking and currently smoke or have quit within the past 15 years. Fecal occult blood test (FOBT) of the stool. You may have this test every year starting at age 77. Flexible sigmoidoscopy or colonoscopy. You may have a sigmoidoscopy every 5 years or a colonoscopy every 10 years starting at age 57. Prostate cancer screening. Recommendations will vary depending on your family history and other risks. Hepatitis C blood test. Hepatitis B blood test. Sexually transmitted disease (STD) testing. Diabetes screening. This is done by checking your blood sugar (glucose) after you have not eaten for a while (fasting). You may have this done every 1-3 years. Abdominal aortic aneurysm (AAA) screening. You may need this if you are a current or former smoker. Osteoporosis. You may be screened starting at age 74 if you are at high risk. Talk with your health care provider about your test results, treatment options, and if necessary, the need for more tests. Vaccines  Your health care provider may recommend certain vaccines, such as: Influenza vaccine. This is recommended every year. Tetanus, diphtheria, and acellular pertussis (Tdap, Td) vaccine.  You may need a Td booster every 10 years. Zoster vaccine. You may need this after age  9. Pneumococcal 13-valent conjugate (PCV13) vaccine. One dose is recommended after age 39. Pneumococcal polysaccharide (PPSV23) vaccine. One dose is recommended after age 40. Talk to your health care provider about which screenings and vaccines you need and how often you need them. This information is not intended to replace advice given to you by your health care provider. Make sure you discuss any questions you have with your health care provider. Document Released: 11/08/2015 Document Revised: 07/01/2016 Document Reviewed: 08/13/2015 Elsevier Interactive Patient Education  2017 [place] Prevention in the Home Falls can cause injuries. They can happen to people of all ages. There are many things you can do to make your home safe and to help prevent falls. What can I do on the outside of my home? Regularly fix the edges of walkways and driveways and fix any cracks. Remove anything that might make you trip as you walk through a door, such as a raised step or threshold. Trim any bushes or trees on the path to your home. Use bright outdoor lighting. Clear any walking paths of anything that might make someone trip, such as rocks or tools. Regularly check to see if handrails are loose or broken. Make sure that both sides of any steps have handrails. Any raised decks and porches should have guardrails on the edges. Have any leaves, snow, or ice cleared regularly. Use sand or salt on walking paths during winter. Clean up any spills in your garage right away. This includes oil or grease spills. What can I do in the bathroom? Use night lights. Install grab bars by the toilet and in the tub and shower. Do not use towel bars as grab bars. Use non-skid mats or decals in the tub or shower. If you need to sit down in the shower, use a plastic, non-slip stool. Keep the floor dry. Clean up any water that spills on the floor as soon as it happens. Remove soap buildup in the tub or shower  regularly. Attach bath mats securely with double-sided non-slip rug tape. Do not have throw rugs and other things on the floor that can make you trip. What can I do in the bedroom? Use night lights. Make sure that you have a light by your bed that is easy to reach. Do not use any sheets or blankets that are too big for your bed. They should not hang down onto the floor. Have a firm chair that has side arms. You can use this for support while you get dressed. Do not have throw rugs and other things on the floor that can make you trip. What can I do in the kitchen? Clean up any spills right away. Avoid walking on wet floors. Keep items that you use a lot in easy-to-reach places. If you need to reach something above you, use a strong step stool that has a grab bar. Keep electrical cords out of the way. Do not use floor polish or wax that makes floors slippery. If you must use wax, use non-skid floor wax. Do not have throw rugs and other things on the floor that can make you trip. What can I do with my stairs? Do not leave any items on the stairs. Make sure that there are handrails on both sides of the stairs and use them. Fix handrails that are broken or loose. Make sure that handrails are as long as  the stairways. Check any carpeting to make sure that it is firmly attached to the stairs. Fix any carpet that is loose or worn. Avoid having throw rugs at the top or bottom of the stairs. If you do have throw rugs, attach them to the floor with carpet tape. Make sure that you have a light switch at the top of the stairs and the bottom of the stairs. If you do not have them, ask someone to add them for you. What else can I do to help prevent falls? Wear shoes that: Do not have high heels. Have rubber bottoms. Are comfortable and fit you well. Are closed at the toe. Do not wear sandals. If you use a stepladder: Make sure that it is fully opened. Do not climb a closed stepladder. Make sure that  both sides of the stepladder are locked into place. Ask someone to hold it for you, if possible. Clearly mark and make sure that you can see: Any grab bars or handrails. First and last steps. Where the edge of each step is. Use tools that help you move around (mobility aids) if they are needed. These include: Canes. Walkers. Scooters. Crutches. Turn on the lights when you go into a dark area. Replace any light bulbs as soon as they burn out. Set up your furniture so you have a clear path. Avoid moving your furniture around. If any of your floors are uneven, fix them. If there are any pets around you, be aware of where they are. Review your medicines with your doctor. Some medicines can make you feel dizzy. This can increase your chance of falling. Ask your doctor what other things that you can do to help prevent falls. This information is not intended to replace advice given to you by your health care provider. Make sure you discuss any questions you have with your health care provider. Document Released: 08/08/2009 Document Revised: 03/19/2016 Document Reviewed: 11/16/2014 Elsevier Interactive Patient Education  2017 [employer].

## 2021-05-30 NOTE — Progress Notes (Signed)
Virtual Visit via Telephone Note  I connected with  Richard Grant on 05/30/21 at  8:45 AM EDT by telephone and verified that I am speaking with the correct person using two identifiers.  Medicare Annual Wellness visit completed telephonically due to Covid-19 pandemic.   Persons participating in this call: This Health Coach and this patient.   Location: Patient: Home Provider: Office   I discussed the limitations, risks, security and privacy concerns of performing an evaluation and management service by telephone and the availability of in person appointments. The patient expressed understanding and agreed to proceed.  Unable to perform video visit due to video visit attempted and failed and/or patient does not have video capability.   Some vital signs may be absent or patient reported.   Richard Brace, LPN   Subjective:   Richard Grant is a 70 y.o. male who presents for an Initial Medicare Annual Wellness Visit.  Review of Systems     Cardiac Risk Factors include: advanced age (>14mn, >>66women);hypertension;male gender     Objective:    There were no vitals filed for this visit. There is no height or weight on file to calculate BMI.  Advanced Directives 05/30/2021  Does Patient Have a Medical Advance Directive? Yes  Type of Advance Directive Living will    Current Medications (verified) Outpatient Encounter Medications as of 05/30/2021  Medication Sig   hydrochlorothiazide (HYDRODIURIL) 25 MG tablet Take 25 mg by mouth daily.   mesalamine (APRISO) 0.375 g 24 hr capsule Take 4 capsules (1.5 g total) by mouth daily.   metoprolol tartrate (LOPRESSOR) 50 MG tablet Take 50 mg by mouth daily at 8 pm.   Milk Thistle 500 MG CAPS Take 1 capsule by mouth daily.   MULTIPLE VITAMIN PO Take 1 capsule by mouth daily.   pantoprazole (PROTONIX) 20 MG tablet Take 1 tablet (20 mg total) by mouth daily before breakfast.   Saw Palmetto, Serenoa repens, (SAW PALMETTO PO) Take 1 capsule by  mouth daily.   MODERNA COVID-19 VACCINE 100 MCG/0.5ML injection    Facility-Administered Encounter Medications as of 05/30/2021  Medication   0.9 %  sodium chloride infusion    Allergies (verified) Lactose intolerance (gi) and Latex   History: Past Medical History:  Diagnosis Date   Arthritis    GERD (gastroesophageal reflux disease)    Hypertension    Past Surgical History:  Procedure Laterality Date   HERNIA REPAIR  2012   Family History  Problem Relation Age of Onset   Arthritis Mother    Hypertension Mother    Diabetes Father    Early death Father    Hypertension Father    Drug abuse Sister    Mental illness Sister    Leukemia Maternal Grandfather    Liver cancer Paternal Grandfather    Colon cancer Neg Hx    Esophageal cancer Neg Hx    Stomach cancer Neg Hx    Pancreatic cancer Neg Hx    Social History   Socioeconomic History   Marital status: DSoil scientist   Spouse name: Not on file   Number of children: Not on file   Years of education: Not on file   Highest education level: Not on file  Occupational History   Not on file  Tobacco Use   Smoking status: Never   Smokeless tobacco: Never  Substance and Sexual Activity   Alcohol use: Yes    Alcohol/week: 3.0 standard drinks    Types: 3 Glasses of  wine per week    Comment: DAILY    Drug use: Never   Sexual activity: Not on file  Other Topics Concern   Not on file  Social History Narrative   Not on file   Social Determinants of Health   Financial Resource Strain: Low Risk    Difficulty of Paying Living Expenses: Not hard at all  Food Insecurity: No Food Insecurity   Worried About Charity fundraiser in the Last Year: Never true   [place] in the Last Year: Never true  Transportation Needs: No Transportation Needs   Lack of Transportation (Medical): No   Lack of Transportation (Non-Medical): No  Physical Activity: Insufficiently Active   Days of Exercise per Week: 4 days   Minutes of  Exercise per Session: 20 min  Stress: No Stress Concern Present   Feeling of Stress : Not at all  Social Connections: Moderately Isolated   Frequency of Communication with Friends and Family: Three times a week   Frequency of Social Gatherings with Friends and Family: Never   Attends Religious Services: Never   Marine scientist or Organizations: No   Attends Music therapist: Never   Marital Status: Living with partner    Tobacco Counseling Counseling given: Not Answered   Clinical Intake:  Pre-visit preparation completed: Yes  Pain : No/denies pain     BMI - recorded: 22.5 Nutritional Status: BMI of 19-24  Normal Nutritional Risks: Nausea/ vomitting/ diarrhea (loose stool continues) Diabetes: No  How often do you need to have someone help you when you read instructions, pamphlets, or other written materials from your doctor or pharmacy?: 1 - Never  Diabetic?No  Interpreter Needed?: No  Information entered by :: Richard Rakes, LPN   Activities of Daily Living In your present state of health, do you have any difficulty performing the following activities: 05/30/2021 12/05/2020  Hearing? N N  Vision? N N  Difficulty concentrating or making decisions? N N  Walking or climbing stairs? N N  Dressing or bathing? N N  Doing errands, shopping? N N  Preparing Food and eating ? N -  Using the Toilet? N -  In the past six months, have you accidently leaked urine? N -  Do you have problems with loss of bowel control? N -  Managing your Medications? N -  Managing your Finances? N -  Housekeeping or managing your Housekeeping? N -    Patient Care Team: Richard Barrack, MD as PCP - General (Family Medicine)  Indicate any recent Medical Services you may have received from other than [facility] providers in the past year (date may be approximate).     Assessment:   This is a routine wellness examination for Richard Grant.  Hearing/Vision screen Hearing Screening -  Comments:: Pt denies any hearing issues  Vision Screening - Comments:: Pt encouraged to follow up with eye exams for annual check up   Dietary issues and exercise activities discussed: Current Exercise Habits: Home exercise routine, Type of exercise: walking;Other - see comments, Time (Minutes): 20, Frequency (Times/Week): 4, Weekly Exercise (Minutes/Week): 80   Goals Addressed             This Visit's Progress    Patient Stated       Stay healthy and alive        Depression Screen PHQ 2/9 Scores 05/30/2021 12/05/2020  PHQ - 2 Score 0 0    Fall Risk Fall Risk  05/30/2021  Falls in the past year? 0  Number falls in past yr: 0  Injury with Fall? 0  Risk for fall due to : Impaired vision  Follow up Falls prevention discussed    FALL RISK PREVENTION PERTAINING TO THE HOME:  Any stairs in or around the home? Yes  If so, are there any without handrails? No  Home free of loose throw rugs in walkways, pet beds, electrical cords, etc? Yes  Adequate lighting in your home to reduce risk of falls? Yes   ASSISTIVE DEVICES UTILIZED TO PREVENT FALLS:  Life alert? No  Use of a cane, walker or w/c? No  Grab bars in the bathroom? No  Shower chair or bench in shower? No  Elevated toilet seat or a handicapped toilet? No   TIMED UP AND GO:  Was the test performed? No .   Cognitive Function:     6CIT Screen 05/30/2021  What Year? 0 points  What month? 0 points  What time? 0 points  Count back from 20 0 points  Months in reverse 0 points  Repeat phrase 0 points  Total Score 0    Immunizations Immunization History  Administered Date(s) Administered   Influenza-Unspecified 07/26/2020   Moderna Sars-Covid-2 Vaccination 01/10/2020, 02/08/2020, 08/28/2020, 02/12/2021   PNEUMOCOCCAL CONJUGATE-20 04/05/2021   Tdap 02/17/2021    TDAP status: Up to date  Flu Vaccine status: Due, Education has been provided regarding the importance of this vaccine. Advised may receive this vaccine  at local pharmacy or Health Dept. Aware to provide a copy of the vaccination record if obtained from local pharmacy or Health Dept. Verbalized acceptance and understanding.  Pneumococcal vaccine status: Up to date  Covid-19 vaccine status: Completed vaccines  Qualifies for Shingles Vaccine? Yes   Zostavax completed No   Shingrix Completed?: No.    Education has been provided regarding the importance of this vaccine. Patient has been advised to call insurance company to determine out of pocket expense if they have not yet received this vaccine. Advised may also receive vaccine at local pharmacy or Health Dept. Verbalized acceptance and understanding.  Screening Tests Health Maintenance  Topic Date Due   Hepatitis C Screening  Never done   Zoster Vaccines- Shingrix (1 of 2) Never done   PNA vac Low Risk Adult (1 of 2 - PCV13) 08/31/2018   INFLUENZA VACCINE  05/26/2021   COVID-19 Vaccine (5 - Booster for Moderna series) 06/14/2021   COLONOSCOPY (Pts 45-77yr Insurance coverage will need to be confirmed)  03/26/2022   TETANUS/TDAP  02/18/2031   HPV VACCINES  Aged Out    Health Maintenance  Health Maintenance Due  Topic Date Due   Hepatitis C Screening  Never done   Zoster Vaccines- Shingrix (1 of 2) Never done   PNA vac Low Risk Adult (1 of 2 - PCV13) 08/31/2018   INFLUENZA VACCINE  05/26/2021    Colorectal cancer screening: Type of screening: Colonoscopy. Completed 03/26/21. Repeat every 1 years  Additional Screening:  Hepatitis C Screening: does qualify; Pt requesting labs on 08/21/21 appt   Vision Screening: Recommended annual ophthalmology exams for early detection of glaucoma and other disorders of the eye. Is the patient up to date with their annual eye exam?  No  Who is the provider or what is the name of the office in which the patient attends annual eye exams? Pt has to establish a provider  If pt is not established with a provider, would they like to be referred to  a  provider to establish care? No .   Dental Screening: Recommended annual dental exams for proper oral hygiene  Community Resource Referral / Chronic Care Management: CRR required this visit?  No   CCM required this visit?  No      Plan:     I have personally reviewed and noted the following in the patient's chart:   Medical and social history Use of alcohol, tobacco or illicit drugs  Current medications and supplements including opioid prescriptions. Patient is not currently taking opioid prescriptions. Functional ability and status Nutritional status Physical activity Advanced directives List of other physicians Hospitalizations, surgeries, and ER visits in previous 12 months Vitals Screenings to include cognitive, depression, and falls Referrals and appointments  In addition, I have reviewed and discussed with patient certain preventive protocols, quality metrics, and best practice recommendations. A written personalized care plan for preventive services as well as general preventive health recommendations were provided to patient.     Richard Brace, LPN   [id number]   Nurse Notes: Pt request that Hep C be drawn at appt 08/21/21 8:00 am .

## 2021-06-17 ENCOUNTER — Other Ambulatory Visit (INDEPENDENT_AMBULATORY_CARE_PROVIDER_SITE_OTHER): Payer: Medicare HMO

## 2021-06-17 ENCOUNTER — Encounter: Payer: Self-pay | Admitting: Internal Medicine

## 2021-06-17 ENCOUNTER — Ambulatory Visit: Payer: Medicare HMO | Admitting: Internal Medicine

## 2021-06-17 VITALS — BP 150/80 | HR 84 | Ht 62.0 in | Wt 135.5 lb

## 2021-06-17 DIAGNOSIS — K2270 Barrett's esophagus without dysplasia: Secondary | ICD-10-CM

## 2021-06-17 DIAGNOSIS — K2100 Gastro-esophageal reflux disease with esophagitis, without bleeding: Secondary | ICD-10-CM | POA: Diagnosis not present

## 2021-06-17 DIAGNOSIS — K51018 Ulcerative (chronic) pancolitis with other complication: Secondary | ICD-10-CM

## 2021-06-17 DIAGNOSIS — E739 Lactose intolerance, unspecified: Secondary | ICD-10-CM

## 2021-06-17 DIAGNOSIS — K529 Noninfective gastroenteritis and colitis, unspecified: Secondary | ICD-10-CM | POA: Diagnosis not present

## 2021-06-17 DIAGNOSIS — Z1339 Encounter for screening examination for other mental health and behavioral disorders: Secondary | ICD-10-CM

## 2021-06-17 DIAGNOSIS — R634 Abnormal weight loss: Secondary | ICD-10-CM

## 2021-06-17 LAB — CBC WITH DIFFERENTIAL/PLATELET
Basophils Absolute: 0 10*3/uL (ref 0.0–0.1)
Basophils Relative: 0.3 % (ref 0.0–3.0)
Eosinophils Absolute: 0 10*3/uL (ref 0.0–0.7)
Eosinophils Relative: 0.3 % (ref 0.0–5.0)
HCT: 40.6 % (ref 39.0–52.0)
Hemoglobin: 13.8 g/dL (ref 13.0–17.0)
Lymphocytes Relative: 7.2 % — ABNORMAL LOW (ref 12.0–46.0)
Lymphs Abs: 0.9 10*3/uL (ref 0.7–4.0)
MCHC: 33.9 g/dL (ref 30.0–36.0)
MCV: 100.5 fl — ABNORMAL HIGH (ref 78.0–100.0)
Monocytes Absolute: 1.2 10*3/uL — ABNORMAL HIGH (ref 0.1–1.0)
Monocytes Relative: 9.7 % (ref 3.0–12.0)
Neutro Abs: 10.3 10*3/uL — ABNORMAL HIGH (ref 1.4–7.7)
Neutrophils Relative %: 82.5 % — ABNORMAL HIGH (ref 43.0–77.0)
Platelets: 375 10*3/uL (ref 150.0–400.0)
RBC: 4.04 Mil/uL — ABNORMAL LOW (ref 4.22–5.81)
RDW: 13.6 % (ref 11.5–15.5)
WBC: 12.5 10*3/uL — ABNORMAL HIGH (ref 4.0–10.5)

## 2021-06-17 LAB — COMPREHENSIVE METABOLIC PANEL
ALT: 11 U/L (ref 0–53)
AST: 16 U/L (ref 0–37)
Albumin: 4.1 g/dL (ref 3.5–5.2)
Alkaline Phosphatase: 68 U/L (ref 39–117)
BUN: 15 mg/dL (ref 6–23)
CO2: 25 mEq/L (ref 19–32)
Calcium: 9.5 mg/dL (ref 8.4–10.5)
Chloride: 99 mEq/L (ref 96–112)
Creatinine, Ser: 1.18 mg/dL (ref 0.40–1.50)
GFR: 63.72 mL/min (ref >60.00)
Glucose, Bld: 94 mg/dL (ref 70–99)
Potassium: 3.6 mEq/L (ref 3.5–5.1)
Sodium: 136 mEq/L (ref 135–145)
Total Bilirubin: 0.7 mg/dL (ref 0.2–1.2)
Total Protein: 7.9 g/dL (ref 6.0–8.3)

## 2021-06-17 LAB — C-REACTIVE PROTEIN: CRP: 1.2 mg/dL (ref 0.5–20.0)

## 2021-06-17 NOTE — Patient Instructions (Addendum)
Your provider has requested that you go to the basement level for lab work before leaving today. Press "B" on the elevator. The lab is located at the first door on the left as you exit the elevator.  Due to recent changes in healthcare laws, you may see the results of your imaging and laboratory studies on MyChart before your provider has had a chance to review them.  We understand that in some cases there may be results that are confusing or concerning to you. Not all laboratory results come back in the same time frame and the provider may be waiting for multiple results in order to interpret others.  Please give [place] 48 hours in order for your provider to thoroughly review all the results before contacting the office for clarification of your results.   If you go to refill your Apriso and the cost is to much don't pick it up and call us.  Try over the counter Lactaid and we are giving you information to read about lactose intolerance.  Try and decrease your wine consumption as we discussed.   We will see you in October.    I appreciate the opportunity to care for you. Silvano Rusk, MD, [facility]

## 2021-06-17 NOTE — Assessment & Plan Note (Signed)
Repeat EGD in 1 year given inflammatory changes.  Continue PPI.

## 2021-06-17 NOTE — Progress Notes (Signed)
Richard Grant 70 y.o. 07/15/1951 [id number]  Assessment & Plan:   Encounter Diagnoses  Name Primary?   Ulcerative pancolitis with other complication (HCC) Yes   Backwash Ileitis    Gastroesophageal reflux disease with esophagitis without hemorrhage    Barrett's esophagus without dysplasia    Lactose intolerance ?    Alcohol consumption of one to four drinks per day on alcohol screening    Loss of weight    Ulcerative colitis ([place]) Essentially the same.  It may be too soon to say that [name] did not work though I am concerned about lack of improvement at this point.  There are some dietary issues that could potentially be playing a role though he has possibly been lactose intolerant and also been had regular wine consumer of 3 or more a day for many years so unlikely to be an issue.  I do not think he is having paradoxical diarrhea from the Apriso though I cannot rule it out.  Plan is to recheck labs today see me in October continue as needed Imodium.  If he fails to respond by then I think we can consider a treatment failure.  Consider the use of fecal calprotectin and the work-up.  We may need to use [place] medications to see if they work as opposed to ramping up to immunomodulators or Biologics.  I think those may be cost prohibitive for him.  I have asked him to reduce his alcohol consumption.  I think that is in his best interest.  Fortunately no signs of liver disease.  Gastroesophageal reflux disease Pantoprazole is working well controlling symptoms.  He will continue.  Barrett's esophagus Repeat EGD in 1 year given inflammatory changes.  Continue PPI.  Loss of weight Much better question if this is a sign that the ulcerative colitis is actually improved    Orders Placed This Encounter  Procedures   CBC with Differential/Platelet   Comprehensive metabolic panel   C-reactive protein    08/22/2021 is date of next appointment with me   I appreciate the  opportunity to care for this patient. CC: Richard Barrack, MD   Subjective:   Chief Complaint: Follow-up of Barrett's esophagus and ulcerative colitis  HPI 70 year old white man with a history of Barrett's esophagus status post EGD in June with inflammatory changes and Barrett's, no dysplasia.  He had a colonoscopy at the same time due to chronic diarrhea and was found to have chronic colitis suspicious for inflammatory bowel disease and was started on Apriso.  EGD and colonoscopy March 26, 2021 - Esophageal mucosal changes consistent with short-segment Barrett's esophagus. Biopsied.-Inflammatory changes and intestinal metaplasia without dysplasia - 5 cm hiatal hernia. - Erythematous mucosa in the gastric body and antrum. Biopsied. -Nonspecific gastritis - Gastroesophageal flap valve classified as Hill Grade IV (no fold, wide open lumen, hiatal hernia present). - The examination was otherwise normal. - Biopsies were taken with a cold forceps for evaluation of celiac disease-negative  - Non-thrombosed external hemorrhoids found on perianal exam. - Erythematous and granular mucosa in the terminal ileum. Biopsied. ? backwash ileitis vs Crohn's-chronic colitis - Erythematous, friable (with contact bleeding), granular and vascular-pattern-decreased mucosa in the entire examined colon. Biopsied. Looks like ulcerative colitis-chronic colitis - One diminutive polyp in the rectum, removed with a cold snare. Resected and retrieved. looks inflammatory-was inflammatory - The examination was otherwise normal on direct and retroflexion views.    He reports that he is still having diarrhea.  May be a formed  stool here and there but in general he is about the same.  Not suffering any side effects.  He wonders if lactose consumption may be playing some role as he still eating mac & cheese and pizza etc.  He drinks 3 glasses of wine a day perhaps more on days but has done so for many many years.   Pantoprazole is helping reflux symptomatology a great deal.   Wt Readings from Last 3 Encounters:  06/17/21 135 lb 8 oz (61.5 kg)  03/26/21 123 lb (55.8 kg)  03/19/21 123 lb 6.4 oz (56 kg)    Allergies  Allergen Reactions   Lactose Intolerance (Gi)    Latex    Current Meds  Medication Sig   hydrochlorothiazide (HYDRODIURIL) 25 MG tablet Take 25 mg by mouth daily.   mesalamine (APRISO) 0.375 g 24 hr capsule Take 4 capsules (1.5 g total) by mouth daily.   metoprolol tartrate (LOPRESSOR) 50 MG tablet Take 50 mg by mouth daily at 8 pm.   Milk Thistle 500 MG CAPS Take 1 capsule by mouth daily.   MULTIPLE VITAMIN PO Take 1 capsule by mouth daily.   pantoprazole (PROTONIX) 20 MG tablet Take 1 tablet (20 mg total) by mouth daily before breakfast.   Saw Palmetto, Serenoa repens, (SAW PALMETTO PO) Take 1 capsule by mouth daily.   Past Medical History:  Diagnosis Date   Arthritis    Barrett's esophagus    GERD (gastroesophageal reflux disease)    Hypertension    Ileitis    backwash   Ulcerative pancolitis [facility])    Past Surgical History:  Procedure Laterality Date   COLONOSCOPY  03/26/2021   ESOPHAGOGASTRODUODENOSCOPY  03/26/2021   HERNIA REPAIR  2012   Social History   Social History Narrative   Retired - Chief Financial Officer x 26 yrs   Moved from [place] with life partner   No kids   3 glasses wine/day   Never smoker, no drugs   family history includes Arthritis in his mother; Diabetes in his father; Drug abuse in his sister; Early death in his father; Hypertension in his father and mother; Leukemia in his maternal grandfather; Liver cancer in his paternal grandfather; Mental illness in his sister.   Review of Systems As per HPI  Objective:   Physical Exam BP (!) 150/80 (BP Location: Left Arm, Patient Position: Sitting, Cuff Size: Normal)   Pulse 84   Ht _0  (1.575 m) Comment: height measured without shoes  Wt 135 lb 8 oz (61.5 kg)   BMI 24.78  kg/m   Well-developed well-nourished white man in no acute distress Abdomen is soft nontender

## 2021-06-17 NOTE — Assessment & Plan Note (Signed)
Much better question if this is a sign that the ulcerative colitis is actually improved

## 2021-06-17 NOTE — Assessment & Plan Note (Signed)
Pantoprazole is working well controlling symptoms.  He will continue.

## 2021-06-17 NOTE — Assessment & Plan Note (Addendum)
Essentially the same.  It may be too soon to say that Richard Grant did not work though I am concerned about lack of improvement at this point.  There are some dietary issues that could potentially be playing a role though he has possibly been lactose intolerant and also been had regular wine consumer of 3 or more a day for many years so unlikely to be an issue.  I do not think he is having paradoxical diarrhea from the Apriso though I cannot rule it out.  Plan is to recheck labs today see me in October continue as needed Imodium.  If he fails to respond by then I think we can consider a treatment failure.  Consider the use of fecal calprotectin and the work-up.  We may need to use [place] medications to see if they work as opposed to ramping up to immunomodulators or Biologics.  I think those may be cost prohibitive for him.  I have asked him to reduce his alcohol consumption.  I think that is in his best interest.  Fortunately no signs of liver disease.

## 2021-08-21 ENCOUNTER — Ambulatory Visit (INDEPENDENT_AMBULATORY_CARE_PROVIDER_SITE_OTHER): Payer: Medicare HMO | Admitting: Family Medicine

## 2021-08-21 ENCOUNTER — Encounter: Payer: Self-pay | Admitting: Family Medicine

## 2021-08-21 VITALS — BP 146/76 | HR 75 | Temp 98.1°F | Ht 62.0 in | Wt 133.2 lb

## 2021-08-21 DIAGNOSIS — Z1159 Encounter for screening for other viral diseases: Secondary | ICD-10-CM | POA: Diagnosis not present

## 2021-08-21 DIAGNOSIS — I10 Essential (primary) hypertension: Secondary | ICD-10-CM

## 2021-08-21 DIAGNOSIS — K51018 Ulcerative (chronic) pancolitis with other complication: Secondary | ICD-10-CM

## 2021-08-21 DIAGNOSIS — D729 Disorder of white blood cells, unspecified: Secondary | ICD-10-CM

## 2021-08-21 LAB — CBC WITH DIFFERENTIAL/PLATELET
Basophils Absolute: 0.1 10*3/uL (ref 0.0–0.1)
Basophils Relative: 0.6 % (ref 0.0–3.0)
Eosinophils Absolute: 0 10*3/uL (ref 0.0–0.7)
Eosinophils Relative: 0.5 % (ref 0.0–5.0)
HCT: 38.3 % — ABNORMAL LOW (ref 39.0–52.0)
Hemoglobin: 12.8 g/dL — ABNORMAL LOW (ref 13.0–17.0)
Lymphocytes Relative: 9.1 % — ABNORMAL LOW (ref 12.0–46.0)
Lymphs Abs: 0.9 10*3/uL (ref 0.7–4.0)
MCHC: 33.3 g/dL (ref 30.0–36.0)
MCV: 96 fl (ref 78.0–100.0)
Monocytes Absolute: 1.1 10*3/uL — ABNORMAL HIGH (ref 0.1–1.0)
Monocytes Relative: 11.1 % (ref 3.0–12.0)
Neutro Abs: 7.9 10*3/uL — ABNORMAL HIGH (ref 1.4–7.7)
Neutrophils Relative %: 78.7 % — ABNORMAL HIGH (ref 43.0–77.0)
Platelets: 391 10*3/uL (ref 150.0–400.0)
RBC: 4 Mil/uL — ABNORMAL LOW (ref 4.22–5.81)
RDW: 14.8 % (ref 11.5–15.5)
WBC: 10 10*3/uL (ref 4.0–10.5)

## 2021-08-21 NOTE — Patient Instructions (Signed)
It was very nice to see you today!  We will check blood work today.  Please keep an eye on your blood pressure and let me know if it is persistently 140/90 or higher.  We will see you back in 6 months for your annual checkup with labs.  Please come back sooner if needed.  Take care, Dr [name]  PLEASE NOTE:  If you had any lab tests please let us know if you have not heard back within a few days. You may see your results on mychart before we have a chance to review them but we will give you a call once they are reviewed by [place]. If we ordered any referrals today, please let us know if you have not heard from their office within the next week.   Please try these tips to maintain a healthy lifestyle:  Eat at least 3 REAL meals and 1-2 snacks per day.  Aim for no more than 5 hours between eating.  If you eat breakfast, please do so within one hour of getting up.   Each meal should contain half fruits/vegetables, one quarter protein, and one quarter carbs (no bigger than a computer mouse)  Cut down on sweet beverages. This includes juice, soda, and sweet tea.   Drink at least 1 glass of water with each meal and aim for at least 8 glasses per day  Exercise at least 150 minutes every week.

## 2021-08-21 NOTE — Assessment & Plan Note (Signed)
Recheck WBC today.  Likely had slight neutrophilia due to his underlying ulcerative colitis.

## 2021-08-21 NOTE — Assessment & Plan Note (Addendum)
Initially above goal today.  Repeat was 146/76 at time of office visit.  He has typically well controlled on metoprolol tartrate 50 mg daily and HCTZ 25 mg daily.  Continue home monitoring.  Follow-up in 6 months.

## 2021-08-21 NOTE — Progress Notes (Signed)
   Richard Grant is a 70 y.o. male who presents today for an office visit.  Assessment/Plan:  Chronic Problems Addressed Today: Neutrophilia Recheck WBC today.  Likely had slight neutrophilia due to his underlying ulcerative colitis.  Essential hypertension Initially above goal today.  Repeat was 146/76 at time of office visit.  He has typically well controlled on metoprolol tartrate 50 mg daily and HCTZ 25 mg daily.  Continue home monitoring.  Follow-up in 6 months.  Ulcerative Colitis Follows with GI.  He has not noticed much of a difference of mesalamine.  This could be contributing to his neutrophilia.  He will follow-up with GI tomorrow.    Subjective:  HPI:  He is here for follow up. Last seen about 6 months ago. He was noted to have elevated white blood cell count in our last visit. He was referred to a gastroenterologist for chronic diarrhea. His work up there showed ulcerative colitis. He notes that diarrhea has been persistent for the past few months. He would like to check blood work today.         Objective:  Physical Exam: BP (!) 146/76   Pulse 75   Temp 98.1 F (36.7 C) (Temporal)   Ht 5\' 2"  (1.575 m)   Wt 133 lb 3.2 oz (60.4 kg)   SpO2 99%   BMI 24.36 kg/m   Gen: No acute distress, resting comfortably CV: Regular rate and rhythm with no murmurs appreciated Pulm: Normal work of breathing, clear to auscultation bilaterally with no crackles, wheezes, or rhonchi Neuro: Grossly normal, moves all extremities Psych: Normal affect and thought content      I,Savera Zaman,acting as a scribe for Dimas Chyle, MD.,have documented all relevant documentation on the behalf of Dimas Chyle, MD,as directed by  Dimas Chyle, MD while in the presence of Dimas Chyle, MD.   I, Dimas Chyle, MD, have reviewed all documentation for this visit. The documentation on 08/21/21 for the exam, diagnosis, procedures, and orders are all accurate and complete.  Algis Greenhouse. [name],  MD 08/21/2021 8:41 AM

## 2021-08-22 ENCOUNTER — Ambulatory Visit: Payer: Medicare HMO | Admitting: Internal Medicine

## 2021-08-22 ENCOUNTER — Encounter: Payer: Self-pay | Admitting: Internal Medicine

## 2021-08-22 ENCOUNTER — Telehealth: Payer: Self-pay

## 2021-08-22 VITALS — BP 148/70 | HR 80 | Ht 62.0 in | Wt 134.5 lb

## 2021-08-22 DIAGNOSIS — K51018 Ulcerative (chronic) pancolitis with other complication: Secondary | ICD-10-CM | POA: Diagnosis not present

## 2021-08-22 DIAGNOSIS — K529 Noninfective gastroenteritis and colitis, unspecified: Secondary | ICD-10-CM

## 2021-08-22 LAB — HEPATITIS C ANTIBODY
Hepatitis C Ab: NONREACTIVE
SIGNAL TO CUT-OFF: 0.05 (ref <1.00)

## 2021-08-22 MED ORDER — BUDESONIDE ER 9 MG PO TB24: 9.0000 mg | ORAL_TABLET | Freq: Every day | ORAL | 1 refills | Status: DC

## 2021-08-22 MED ORDER — BALSALAZIDE DISODIUM 750 MG PO CAPS: 2250.0000 mg | ORAL_CAPSULE | Freq: Three times a day (TID) | ORAL | 0 refills | Status: DC

## 2021-08-22 NOTE — Progress Notes (Signed)
Please inform patient of the following:  His hepatitis C test is negative. His WBC is back to normal. His hemoglobin dropped a but but I think this is due to his ulcerative colitis. We can recheck again in a few mnths. Do not need to do any further testing at this point.  Algis Greenhouse. [name], MD 08/22/2021 4:36 PM

## 2021-08-22 NOTE — Telephone Encounter (Signed)
I have started a prior authorization thru [place] for Mr Richard Grant Budesonide for his Ulcerative pancolitis K51.018 and backwash ileitis K52.9. Will await the outcome.

## 2021-08-22 NOTE — Telephone Encounter (Signed)
Please contact the patient and explained that there are other medications that his coverage requires he try before the budesonide can be covered.  This is not unreasonable overall.  So I have prescribed balsalazide to be taken 2.25 g 3 times daily.  If that does not work after 2 months I think we can then get some budesonide.  If he feels like he is suffering too much we can do some prednisone though medically it does not sound like we need to but I am okay with that if he wants to see if that improves things significantly on a temporary basis at least.

## 2021-08-22 NOTE — Telephone Encounter (Signed)
This has been denied, will forward to Dr Carlean Purl to advise. A form came across the fax and I have given this to Dr Carlean Purl.

## 2021-08-22 NOTE — Progress Notes (Signed)
Richard Grant 70 y.o. 01-Jun-1951 [id number]  Assessment & Plan:   Encounter Diagnoses  Name Primary?   Ulcerative pancolitis with other complication ([place]) Yes   Backwash Ileitis    Continued symptoms with loose stools-diarrhea.  Not much different after 4 months of Apriso.  Meds ordered this encounter  Medications   Budesonide ER 9 MG TB24    Sig: Take 9 mg by mouth daily.    Dispense:  30 tablet    Refill:  1   Treat with budesonide for 2 months and reassess.  Reviewed possibility of immunomodulators or Biologics.  If budesonide is not affordable we would consider prednisone.  His budesonide was denied.  Formulary coverage dictates he should try sulfasalazine or balsalazide first.  We will try balsalazide 2.25 g 3 times daily and regroup at follow-up.  If he feels like he needs symptomatic benefit before that we can do a brief trial of prednisone.  We will contact him about this.    Subjective:   Chief Complaint: Follow-up of ulcerative colitis  HPI Richard Grant is here for follow-up, he has pan ulcerative colitis diagnosed in June, with backwash ileitis.  He continues to have bothersome diarrhea several times a day loose watery stools and sometimes when he urinates at night he may end up having watery stool.  If he uses an Imodium that helps him avoid having problems when he leaves the house.  Otherwise his activities of daily life are okay.  Some occasional lower abdominal cramps reported as well. Allergies  Allergen Reactions   Lactose Intolerance (Gi)    Latex    Current Meds  Medication Sig   hydrochlorothiazide (HYDRODIURIL) 25 MG tablet Take 25 mg by mouth daily.   mesalamine (APRISO) 0.375 g 24 hr capsule Take 4 capsules (1.5 g total) by mouth daily.   metoprolol tartrate (LOPRESSOR) 50 MG tablet Take 50 mg by mouth daily at 8 pm.   Milk Thistle 500 MG CAPS Take 1 capsule by mouth daily.   MULTIPLE VITAMIN PO Take 1 capsule by mouth daily.   pantoprazole (PROTONIX)  20 MG tablet Take 1 tablet (20 mg total) by mouth daily before breakfast.   Saw Palmetto, Serenoa repens, (SAW PALMETTO PO) Take 1 capsule by mouth daily.   Past Medical History:  Diagnosis Date   Arthritis    Barrett's esophagus    GERD (gastroesophageal reflux disease)    Hypertension    Ileitis    backwash   Ulcerative pancolitis [facility])    Past Surgical History:  Procedure Laterality Date   COLONOSCOPY  03/26/2021   ESOPHAGOGASTRODUODENOSCOPY  03/26/2021   HERNIA REPAIR  2012   Social History   Social History Narrative   Retired - Chief Financial Officer x 85 yrs   Moved from [place] with life partner   No kids   3 glasses wine/day   Never smoker, no drugs   family history includes Arthritis in his mother; Diabetes in his father; Drug abuse in his sister; Early death in his father; Hypertension in his father and mother; Leukemia in his maternal grandfather; Liver cancer in his paternal grandfather; Mental illness in his sister.   Review of Systems  As above Objective:   Physical Exam BP (!) 148/70   Pulse 80   Ht 5\' 2"  (1.575 m)   Wt 134 lb 8 oz (61 kg)   BMI 24.60 kg/m  Well-developed well-nourished white man thin no acute distress Abdomen is soft nontender  no organomegaly or mass

## 2021-08-22 NOTE — Patient Instructions (Signed)
We have sent the following medications to your pharmacy for you to pick up at your convenience: Budesonide , call if too expensive  If you are age 68 or older, your body mass index should be between 23-30. Your Body mass index is 24.6 kg/m. If this is out of the aforementioned range listed, please consider follow up with your Primary Care Provider.  If you are age 48 or younger, your body mass index should be between 19-25. Your Body mass index is 24.6 kg/m. If this is out of the aformentioned range listed, please consider follow up with your Primary Care Provider.   ________________________________________________________  The [place] [facility] providers would like to encourage you to use [facility] to communicate with providers for non-urgent requests or questions.  Due to long hold times on the telephone, sending your provider a message by [facility] may be a faster and more efficient way to get a response.  Please allow 48 business hours for a response.  Please remember that this is for non-urgent requests.  _______________________________________________________   I appreciate the opportunity to care for you. Silvano Rusk, MD, [facility]

## 2021-08-25 NOTE — Telephone Encounter (Signed)
Patient informed and will try the balsalazide.

## 2021-10-21 ENCOUNTER — Other Ambulatory Visit: Payer: Medicare HMO

## 2021-10-28 ENCOUNTER — Ambulatory Visit: Payer: Medicare HMO | Admitting: Internal Medicine

## 2021-10-28 ENCOUNTER — Encounter: Payer: Self-pay | Admitting: Internal Medicine

## 2021-10-28 VITALS — BP 138/80 | HR 89 | Ht 62.0 in | Wt 131.4 lb

## 2021-10-28 DIAGNOSIS — K51018 Ulcerative (chronic) pancolitis with other complication: Secondary | ICD-10-CM

## 2021-10-28 MED ORDER — AMBULATORY NON FORMULARY MEDICATION: 1 refills | Status: DC

## 2021-10-28 NOTE — Progress Notes (Signed)
Richard Grant 71 y.o. 12/16/1950 [id number]  Assessment & Plan:   Ulcerative colitis [facility]) Diagnosed June 2022 a colonoscopy, backwash ileitis also.  Initially treated with Apriso in June  He is about the same on balsalazide.  The first couple of weeks he did not take it correctly but he has been on it about 6 weeks correctly.  We are going to try budesonide from the compounding pharmacy [facility]) in [place] to take 10 mg daily for about 8 weeks and see him in follow-up.  Perhaps getting into remission with the steroid will allow the balsalazide to maintain a remission.  That is my hope.  Budesonide is not on his formulary otherwise.  CC: Richard Barrack, MD   Subjective:   Chief Complaint: Follow-up of ulcerative colitis  HPI Richard Grant returns reporting having up to 6 loose stools a day and waking up at night for defecation.  When he first started balsalazide in October/November he was taking 1 3 times a day instead of 3 3 times a day after about 2 weeks he figured this out.  He thinks he is really about the same in the way he has been for some time.  No significant abdominal pain weight as below.  We had tried to use budesonide previously but it was denied by his insurance company.  Wt Readings from Last 3 Encounters:  10/28/21 131 lb 6 oz (59.6 kg)  08/22/21 134 lb 8 oz (61 kg)  08/21/21 133 lb 3.2 oz (60.4 kg)     Allergies  Allergen Reactions   Lactose Intolerance (Gi)    Latex    Current Meds  Medication Sig   balsalazide (COLAZAL) 750 MG capsule Take 3 capsules (2,250 mg total) by mouth 3 (three) times daily.   hydrochlorothiazide (HYDRODIURIL) 25 MG tablet Take 25 mg by mouth daily.   metoprolol tartrate (LOPRESSOR) 50 MG tablet Take 50 mg by mouth daily at 8 pm.   Milk Thistle 500 MG CAPS Take 1 capsule by mouth daily.   MULTIPLE VITAMIN PO Take 1 capsule by mouth daily.   pantoprazole (PROTONIX) 20 MG tablet Take 1 tablet (20 mg total) by mouth daily before  breakfast.   Saw Palmetto, Serenoa repens, (SAW PALMETTO PO) Take 1 capsule by mouth daily.   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   Review of Systems As per HPI  Objective:   Physical Exam BP 138/80    Pulse 89    Ht 5\' 2"  (1.575 m)    Wt 131 lb 6 oz (59.6 kg)    SpO2 100%    BMI 24.03 kg/m  Well-developed well-nourished no acute distress

## 2021-10-28 NOTE — Patient Instructions (Addendum)
If you are age 69 or older, your body mass index should be between 23-30. Your Body mass index is 24.03 kg/m. If this is out of the aforementioned range listed, please consider follow up with your Primary Care Provider.  If you are age 72 or younger, your body mass index should be between 19-25. Your Body mass index is 24.03 kg/m. If this is out of the aformentioned range listed, please consider follow up with your Primary Care Provider.   ________________________________________________________  The [place] [facility] providers would like to encourage you to use [facility] to communicate with providers for non-urgent requests or questions.  Due to long hold times on the telephone, sending your provider a message by [facility] may be a faster and more efficient way to get a response.  Please allow 48 business hours for a response.  Please remember that this is for non-urgent requests.  _______________________________________________________  We have sent the following medications to your pharmacy for you to pick up at your convenience: Budesonide capsules, rx faxed to [facility], patient aware of address and phone number.  I appreciate the opportunity to care for you. Silvano Rusk, MD, [facility]

## 2021-10-28 NOTE — Assessment & Plan Note (Addendum)
Diagnosed June 2022 a colonoscopy, backwash ileitis also.  Initially treated with Apriso in June  He is about the same on balsalazide.  The first couple of weeks he did not take it correctly but he has been on it about 6 weeks correctly.  We are going to try budesonide from the compounding pharmacy [facility]) in [place] to take 10 mg daily for about 8 weeks and see him in follow-up.  Perhaps getting into remission with the steroid will allow the balsalazide to maintain a remission.  That is my hope.  Budesonide is not on his formulary otherwise.

## 2021-11-12 ENCOUNTER — Other Ambulatory Visit: Payer: Self-pay | Admitting: *Deleted

## 2021-11-12 ENCOUNTER — Other Ambulatory Visit: Payer: Self-pay

## 2021-11-12 ENCOUNTER — Other Ambulatory Visit (INDEPENDENT_AMBULATORY_CARE_PROVIDER_SITE_OTHER): Payer: Medicare HMO

## 2021-11-12 DIAGNOSIS — R899 Unspecified abnormal finding in specimens from other organs, systems and tissues: Secondary | ICD-10-CM | POA: Diagnosis not present

## 2021-11-12 LAB — CBC WITH DIFFERENTIAL/PLATELET
Basophils Absolute: 0 10*3/uL (ref 0.0–0.1)
Basophils Relative: 0.5 % (ref 0.0–3.0)
Eosinophils Absolute: 0 10*3/uL (ref 0.0–0.7)
Eosinophils Relative: 0.8 % (ref 0.0–5.0)
HCT: 37.7 % — ABNORMAL LOW (ref 39.0–52.0)
Hemoglobin: 12.3 g/dL — ABNORMAL LOW (ref 13.0–17.0)
Lymphocytes Relative: 25 % (ref 12.0–46.0)
Lymphs Abs: 1.5 10*3/uL (ref 0.7–4.0)
MCHC: 32.7 g/dL (ref 30.0–36.0)
MCV: 91.6 fl (ref 78.0–100.0)
Monocytes Absolute: 0.5 10*3/uL (ref 0.1–1.0)
Monocytes Relative: 7.8 % (ref 3.0–12.0)
Neutro Abs: 3.8 10*3/uL (ref 1.4–7.7)
Neutrophils Relative %: 65.9 % (ref 43.0–77.0)
Platelets: 276 10*3/uL (ref 150.0–400.0)
RBC: 4.11 Mil/uL — ABNORMAL LOW (ref 4.22–5.81)
RDW: 13.7 % (ref 11.5–15.5)
WBC: 5.8 10*3/uL (ref 4.0–10.5)

## 2021-11-14 ENCOUNTER — Other Ambulatory Visit: Payer: Self-pay | Admitting: *Deleted

## 2021-11-14 DIAGNOSIS — D649 Anemia, unspecified: Secondary | ICD-10-CM

## 2021-11-14 NOTE — Progress Notes (Signed)
Please inform patient of the following:  His hemoglobin is down a little bit since last time. I think this is probably from his ulcerative colitis.  It would be a good idea to check his iron and B12 levels to make sure that he is not deficient.  Please place future order for ferritin and iron panel as well as folate and b12 levels.  Richard Grant. [name], MD 11/14/2021 12:09 PM

## 2021-11-23 ENCOUNTER — Other Ambulatory Visit: Payer: Self-pay | Admitting: Internal Medicine

## 2021-11-24 ENCOUNTER — Other Ambulatory Visit: Payer: Self-pay | Admitting: Internal Medicine

## 2021-11-24 ENCOUNTER — Encounter: Payer: Self-pay | Admitting: Internal Medicine

## 2021-11-26 ENCOUNTER — Other Ambulatory Visit: Payer: Self-pay

## 2021-11-26 ENCOUNTER — Other Ambulatory Visit (INDEPENDENT_AMBULATORY_CARE_PROVIDER_SITE_OTHER): Payer: Medicare HMO

## 2021-11-26 DIAGNOSIS — D649 Anemia, unspecified: Secondary | ICD-10-CM

## 2021-11-26 LAB — IBC + FERRITIN
Ferritin: 10.3 ng/mL — ABNORMAL LOW (ref 22.0–322.0)
Iron: 72 ug/dL (ref 42–165)
Saturation Ratios: 15.4 % — ABNORMAL LOW (ref 20.0–50.0)
TIBC: 469 ug/dL — ABNORMAL HIGH (ref 250.0–450.0)
Transferrin: 335 mg/dL (ref 212.0–360.0)

## 2021-11-26 LAB — B12 AND FOLATE PANEL
Folate: 11.3 ng/mL (ref >5.9)
Vitamin B-12: 68 pg/mL — ABNORMAL LOW (ref 211–911)

## 2021-11-28 ENCOUNTER — Encounter: Payer: Self-pay | Admitting: Family Medicine

## 2021-11-28 DIAGNOSIS — E538 Deficiency of other specified B group vitamins: Secondary | ICD-10-CM

## 2021-11-28 DIAGNOSIS — E611 Iron deficiency: Secondary | ICD-10-CM | POA: Insufficient documentation

## 2021-11-28 HISTORY — DX: Deficiency of other specified B group vitamins: E53.8

## 2021-11-28 HISTORY — DX: Iron deficiency: E61.1

## 2021-11-28 NOTE — Progress Notes (Signed)
Please inform patient of the following:  He is low in both his iron levels and B12 levels. Both of these can cause anemia.  Recommend he start the B12 injection protocol here.  He should start taking iron supplement ferrous sulfate 65 mg every other day on empty stomach with vitamin C.  We can recheck both in 3 to 6 months.

## 2021-11-28 NOTE — Progress Notes (Signed)
He can try the multivitamin but I am concerned that with history of ulcerative colitis his body may not absorb it.  If he wants to try multivitamin for a few months and then come back for recheck that is fine.

## 2021-11-28 NOTE — Progress Notes (Signed)
He can try a multivitamin though it is possible that this is with his ulcerative colitis his body may not absorb properly.  If he wants to try this and then come back in 3 months to recheck that is okay.  Richard Grant. [name], MD 11/28/2021 9:12 AM

## 2021-12-01 ENCOUNTER — Encounter: Payer: Self-pay | Admitting: Family Medicine

## 2021-12-02 NOTE — Telephone Encounter (Signed)
Spoke with patient per Dr [name]  ferrous sulfate 65 mg every other day on empty stomach with vitamin C Will help for fast absorption. If any issue with stomach problems may take it with food  Patient verbalized understanding

## 2021-12-08 ENCOUNTER — Encounter: Payer: Self-pay | Admitting: Internal Medicine

## 2021-12-08 ENCOUNTER — Other Ambulatory Visit: Payer: Medicare HMO

## 2021-12-08 ENCOUNTER — Ambulatory Visit: Payer: Medicare HMO | Admitting: Internal Medicine

## 2021-12-08 VITALS — BP 148/72 | HR 90 | Ht 62.0 in | Wt 138.0 lb

## 2021-12-08 DIAGNOSIS — E538 Deficiency of other specified B group vitamins: Secondary | ICD-10-CM | POA: Diagnosis not present

## 2021-12-08 DIAGNOSIS — D509 Iron deficiency anemia, unspecified: Secondary | ICD-10-CM

## 2021-12-08 DIAGNOSIS — K51018 Ulcerative (chronic) pancolitis with other complication: Secondary | ICD-10-CM

## 2021-12-08 DIAGNOSIS — R197 Diarrhea, unspecified: Secondary | ICD-10-CM

## 2021-12-08 NOTE — Progress Notes (Signed)
Richard Grant 71 y.o. 01-Dec-1950 [id number]  Assessment & Plan:   Encounter Diagnoses  Name Primary?   Ulcerative pancolitis with other complication (HCC) Yes   Diarrhea, unspecified type    B12 deficiency    Iron deficiency anemia, unspecified iron deficiency anemia type     Unfortunately he still has diarrhea.  At this point I wonder is this due to the colitis or some other cause.  I have seen diarrhea in patients with B12 deficiency.  Ulcerative colitis should not cause B12 deficiency.  I do not think he has Crohn's disease which affects the small bowel though he could but his pattern of pancolitis goes against that.  He will stop balsalazide at this point he will collect a fecal calprotectin to see if there are inflammatory markers in the stool.  I am going to check parietal cell and intrinsic factor antibodies regarding the B12 deficiency.  I think it makes sense for him to go ahead and take some B12 injections probably weekly x4 and then stay on p.o. medication.  I would like to see if boosting his B12 has any effect on his GI symptoms i.e. specifically diarrhea.  He will follow-up with Dr. [name] on this and he will also continue an oral iron supplement.  We had a brief discussion about the possibility of using Humira.  The patient reports that his quality of life is not so bad with this though still has diarrhea and general he says he tolerates his pattern of stools etc.  Follow-up to be determined pending the above.  CC: Vivi Barrack, MD   Subjective:   Chief Complaint: Persistent diarrhea and ulcerative colitis  HPI 71 year old white man with a history of Barrett's esophagus and ulcerative colitis with backwash ileitis diagnosed a colonoscopy summer 2022.  Treated with Apriso and then balsalazide.  Currently on a trial of added budesonide.  More than 1 month of that at this point. The patient continues with a pattern of loose stools he says sometimes he thinks they  are going to become solid but never do.  This is essentially the same as it has been from when I first met him last year before colonoscopy.  He was mildly anemic and was found to have low B12 at 70 and low ferritin at 10.3 last week.  He reports that he had stopped his multivitamin with B12 and iron in it for 3 months.  He had a communication with Dr. Marigene Ehlers office and the patient was a bit reluctant to try injection.  He has restarted his multivitamin has an over-the-counter 65 mg ferrous sulfate medication he just started.  He denies any significant fatigue issues.  Wt Readings from Last 3 Encounters:  12/08/21 138 lb (62.6 kg)  10/28/21 131 lb 6 oz (59.6 kg)  08/22/21 134 lb 8 oz (61 kg)    Allergies  Allergen Reactions   Lactose Intolerance (Gi)    Latex    Current Meds  Medication Sig   AMBULATORY NON FORMULARY MEDICATION Medication Name: Budesonide 31m capsules Sig: Take 2 capsules daily   hydrochlorothiazide (HYDRODIURIL) 25 MG tablet Take 25 mg by mouth daily.   metoprolol tartrate (LOPRESSOR) 50 MG tablet Take 50 mg by mouth daily at 8 pm.   Milk Thistle 500 MG CAPS Take 1 capsule by mouth daily.   MULTIPLE VITAMIN PO Take 1 capsule by mouth daily.   OVER THE COUNTER MEDICATION Iron 63mone daily   pantoprazole (PROTONIX) 20 MG tablet  Take 1 tablet (20 mg total) by mouth daily before breakfast.   Saw Palmetto, Serenoa repens, (SAW PALMETTO PO) Take 1 capsule by mouth daily.   [DISCONTINUED] balsalazide (COLAZAL) 750 MG capsule Take 3 capsules (2,250 mg total) by mouth 3 (three) times daily.   Past Medical History:  Diagnosis Date   Arthritis    B12 deficiency 11/28/2021   Barrett's esophagus    GERD (gastroesophageal reflux disease)    Hypertension    Ileitis    backwash   Iron deficiency 11/28/2021   Ulcerative pancolitis [facility])    Past Surgical History:  Procedure Laterality Date   COLONOSCOPY  03/26/2021   ESOPHAGOGASTRODUODENOSCOPY  03/26/2021   HERNIA REPAIR   2012   Social History   Social History Narrative   Retired - Chief Financial Officer x 42 yrs   Moved from [place] with life partner   No kids   3 glasses wine/day   Never smoker, no drugs   family history includes Arthritis in his mother; Diabetes in his father; Drug abuse in his sister; Early death in his father; Hypertension in his father and mother; Leukemia in his maternal grandfather; Liver cancer in his paternal grandfather; Mental illness in his sister.   Review of Systems As per HPI  Objective:   Physical Exam BP (!) 148/72    Pulse 90    Ht _0  (1.575 m)    Wt 138 lb (62.6 kg)    BMI 25.24 kg/m

## 2021-12-08 NOTE — Patient Instructions (Signed)
If you are age 69 or older, your body mass index should be between 23-30. Your Body mass index is 25.24 kg/m. If this is out of the aforementioned range listed, please consider follow up with your Primary Care Provider.  If you are age 49 or younger, your body mass index should be between 19-25. Your Body mass index is 25.24 kg/m. If this is out of the aformentioned range listed, please consider follow up with your Primary Care Provider.   ________________________________________________________  The [place] [facility] providers would like to encourage you to use [facility] to communicate with providers for non-urgent requests or questions.  Due to long hold times on the telephone, sending your provider a message by [facility] may be a faster and more efficient way to get a response.  Please allow 48 business hours for a response.  Please remember that this is for non-urgent requests.  _______________________________________________________  Your provider has requested that you go to the basement level for lab work before leaving today. Press "B" on the elevator. The lab is located at the first door on the left as you exit the elevator.  Due to recent changes in healthcare laws, you may see the results of your imaging and laboratory studies on MyChart before your provider has had a chance to review them.  We understand that in some cases there may be results that are confusing or concerning to you. Not all laboratory results come back in the same time frame and the provider may be waiting for multiple results in order to interpret others.  Please give [place] 48 hours in order for your provider to thoroughly review all the results before contacting the office for clarification of your results.   Continue your over the counter iron and please reach out to Dr [name] about getting B12 injections.  I appreciate the opportunity to care for you. Silvano Rusk, MD, [facility]

## 2021-12-09 ENCOUNTER — Encounter: Payer: Self-pay | Admitting: Family Medicine

## 2021-12-09 NOTE — Telephone Encounter (Signed)
Please see note and advise, thanks  Richard Grant, [place]

## 2021-12-09 NOTE — Telephone Encounter (Signed)
He can schedule nurse visit for B12 shots.  Algis Greenhouse. [name], MD 12/09/2021 2:10 PM

## 2021-12-10 ENCOUNTER — Ambulatory Visit (INDEPENDENT_AMBULATORY_CARE_PROVIDER_SITE_OTHER): Payer: Medicare HMO

## 2021-12-10 ENCOUNTER — Other Ambulatory Visit: Payer: Self-pay

## 2021-12-10 DIAGNOSIS — E538 Deficiency of other specified B group vitamins: Secondary | ICD-10-CM | POA: Diagnosis not present

## 2021-12-10 MED ORDER — CYANOCOBALAMIN 1000 MCG/ML IJ SOLN
1000.0000 ug | Freq: Once | INTRAMUSCULAR | Status: AC
  Administered 2021-12-10: 1000 ug via INTRAMUSCULAR

## 2021-12-10 NOTE — Progress Notes (Signed)
Pt received b12 injection with no problems.

## 2021-12-11 LAB — ANTI-PARIETAL ANTIBODY: PARIETAL CELL AB SCREEN: NEGATIVE

## 2021-12-11 LAB — INTRINSIC FACTOR ANTIBODIES: Intrinsic Factor: NEGATIVE

## 2021-12-12 ENCOUNTER — Other Ambulatory Visit: Payer: Medicare HMO

## 2021-12-12 DIAGNOSIS — K51018 Ulcerative (chronic) pancolitis with other complication: Secondary | ICD-10-CM | POA: Diagnosis not present

## 2021-12-12 DIAGNOSIS — R197 Diarrhea, unspecified: Secondary | ICD-10-CM

## 2021-12-15 LAB — CALPROTECTIN, FECAL: Calprotectin, Fecal: 405 ug/g — ABNORMAL HIGH (ref 0–120)

## 2021-12-17 ENCOUNTER — Other Ambulatory Visit: Payer: Self-pay

## 2021-12-17 ENCOUNTER — Ambulatory Visit (INDEPENDENT_AMBULATORY_CARE_PROVIDER_SITE_OTHER): Payer: Medicare HMO

## 2021-12-17 DIAGNOSIS — K51018 Ulcerative (chronic) pancolitis with other complication: Secondary | ICD-10-CM

## 2021-12-17 DIAGNOSIS — K529 Noninfective gastroenteritis and colitis, unspecified: Secondary | ICD-10-CM

## 2021-12-17 DIAGNOSIS — E538 Deficiency of other specified B group vitamins: Secondary | ICD-10-CM

## 2021-12-17 MED ORDER — CYANOCOBALAMIN 1000 MCG/ML IJ SOLN
1000.0000 ug | Freq: Once | INTRAMUSCULAR | Status: AC
  Administered 2021-12-17: 1000 ug via INTRAMUSCULAR

## 2021-12-17 NOTE — Progress Notes (Signed)
Pt here for B12 injection for Dr Parker. Injection given in left deltoid. Pt tolerated well.   

## 2021-12-17 NOTE — Progress Notes (Deleted)
Pt here for B12 injection for Dr Parker. Injection given in left deltoid. Pt tolerated well.   

## 2021-12-18 ENCOUNTER — Other Ambulatory Visit: Payer: Self-pay

## 2021-12-23 ENCOUNTER — Other Ambulatory Visit (INDEPENDENT_AMBULATORY_CARE_PROVIDER_SITE_OTHER): Payer: Medicare HMO

## 2021-12-23 DIAGNOSIS — K51018 Ulcerative (chronic) pancolitis with other complication: Secondary | ICD-10-CM | POA: Diagnosis not present

## 2021-12-23 DIAGNOSIS — K529 Noninfective gastroenteritis and colitis, unspecified: Secondary | ICD-10-CM

## 2021-12-23 LAB — BUN: BUN: 19 mg/dL (ref 6–23)

## 2021-12-23 LAB — CREATININE, SERUM: Creatinine, Ser: 1.15 mg/dL (ref 0.40–1.50)

## 2021-12-24 ENCOUNTER — Ambulatory Visit (INDEPENDENT_AMBULATORY_CARE_PROVIDER_SITE_OTHER): Payer: Medicare HMO

## 2021-12-24 ENCOUNTER — Other Ambulatory Visit: Payer: Self-pay

## 2021-12-24 DIAGNOSIS — E538 Deficiency of other specified B group vitamins: Secondary | ICD-10-CM | POA: Diagnosis not present

## 2021-12-24 MED ORDER — CYANOCOBALAMIN 1000 MCG/ML IJ SOLN
1000.0000 ug | Freq: Once | INTRAMUSCULAR | Status: AC
  Administered 2021-12-24: 1000 ug via INTRAMUSCULAR

## 2021-12-24 NOTE — Progress Notes (Signed)
Pt tolerated b12 injection well. 

## 2021-12-25 ENCOUNTER — Ambulatory Visit (HOSPITAL_COMMUNITY): Payer: Medicare HMO

## 2021-12-29 ENCOUNTER — Encounter (HOSPITAL_COMMUNITY): Payer: Self-pay

## 2021-12-29 ENCOUNTER — Ambulatory Visit (HOSPITAL_COMMUNITY)
Admission: RE | Admit: 2021-12-29 | Discharge: 2021-12-29 | Disposition: A | Discharge status: Home / Self Care | Payer: Medicare HMO | Source: Ambulatory Visit | Attending: Internal Medicine | Admitting: Internal Medicine

## 2021-12-29 ENCOUNTER — Other Ambulatory Visit: Payer: Self-pay

## 2021-12-29 DIAGNOSIS — K529 Noninfective gastroenteritis and colitis, unspecified: Secondary | ICD-10-CM

## 2021-12-29 DIAGNOSIS — K51018 Ulcerative (chronic) pancolitis with other complication: Secondary | ICD-10-CM | POA: Diagnosis not present

## 2021-12-29 DIAGNOSIS — D509 Iron deficiency anemia, unspecified: Secondary | ICD-10-CM | POA: Diagnosis not present

## 2021-12-29 DIAGNOSIS — K449 Diaphragmatic hernia without obstruction or gangrene: Secondary | ICD-10-CM | POA: Diagnosis not present

## 2021-12-29 DIAGNOSIS — N281 Cyst of kidney, acquired: Secondary | ICD-10-CM | POA: Diagnosis not present

## 2021-12-29 MED ORDER — SODIUM CHLORIDE (PF) 0.9 % IJ SOLN
INTRAMUSCULAR | Status: AC
  Filled 2021-12-29: qty 50

## 2021-12-29 MED ORDER — BARIUM SULFATE 0.1 % PO SUSP
ORAL | Status: AC
  Filled 2021-12-29: qty 3

## 2021-12-29 MED ORDER — IOHEXOL 300 MG/ML  SOLN
100.0000 mL | Freq: Once | INTRAMUSCULAR | Status: AC | PRN
  Administered 2021-12-29: 100 mL via INTRAVENOUS

## 2021-12-31 ENCOUNTER — Encounter: Payer: Self-pay | Admitting: *Deleted

## 2021-12-31 ENCOUNTER — Ambulatory Visit (INDEPENDENT_AMBULATORY_CARE_PROVIDER_SITE_OTHER): Payer: Medicare HMO | Admitting: *Deleted

## 2021-12-31 DIAGNOSIS — E538 Deficiency of other specified B group vitamins: Secondary | ICD-10-CM

## 2021-12-31 MED ORDER — CYANOCOBALAMIN 1000 MCG/ML IJ SOLN
1000.0000 ug | Freq: Once | INTRAMUSCULAR | Status: AC
  Administered 2021-12-31: 1000 ug via INTRAMUSCULAR

## 2021-12-31 NOTE — Progress Notes (Signed)
Per orders of Dr. [name], injection of Cyanocobalamin 1000 mcg/ml  given IM by Anselmo Pickler, LPN in right deltoid. ?Patient tolerated injection well. Patient will make appointment for 1 month ? ?

## 2021-12-31 NOTE — Progress Notes (Signed)
I have reviewed the patient's encounter and agree with the documentation. ? ?Algis Greenhouse. [name], MD ?12/31/2021 9:41 AM  ? ?

## 2022-01-13 ENCOUNTER — Encounter: Payer: Self-pay | Admitting: Internal Medicine

## 2022-01-13 ENCOUNTER — Ambulatory Visit: Payer: Medicare HMO | Admitting: Internal Medicine

## 2022-01-13 VITALS — BP 140/88 | HR 90 | Ht 62.0 in | Wt 138.0 lb

## 2022-01-13 DIAGNOSIS — E538 Deficiency of other specified B group vitamins: Secondary | ICD-10-CM

## 2022-01-13 DIAGNOSIS — K529 Noninfective gastroenteritis and colitis, unspecified: Secondary | ICD-10-CM

## 2022-01-13 DIAGNOSIS — K51018 Ulcerative (chronic) pancolitis with other complication: Secondary | ICD-10-CM | POA: Diagnosis not present

## 2022-01-13 DIAGNOSIS — D509 Iron deficiency anemia, unspecified: Secondary | ICD-10-CM | POA: Diagnosis not present

## 2022-01-13 MED ORDER — AMBULATORY NON FORMULARY MEDICATION: 2 refills | Status: DC

## 2022-01-13 NOTE — Progress Notes (Signed)
? ?Richard Grant 71 y.o. 1950-11-29 [id number] ? ?Assessment & Plan:  ? ?Encounter Diagnoses  ?Name Primary?  ? Ulcerative pancolitis with other complication ([place]) Yes  ? Backwash Ileitis   ? B12 deficiency   ? Iron deficiency anemia, unspecified iron deficiency anemia type   ? ? ?Richard Grant felt like the budesonide was starting to help.  We had a conversation about the pros and cons of immunomodulators like 6-MP or azathioprine and or Biologics like Humira.  Handouts provided.  He is inclined to retry budesonide understanding that we ideally do not like to use it chronically though it is possible if he understands the risks that include hypertension, cataracts bone fractures, thinning of the skin and others.  He accepts these and wants to try it again so we will treat him for 2 months and regroup and have another discussion about other long-term therapy like immunomodulators and or Biologics. ? ?He will continue B12 and iron supplementation. ? ?Hold off on reassessment with colonoscopy or possibly calprotectin till we have him under better symptom control. ? ?CC: Richard Barrack, MD ? ? ? ?Subjective:  ? ?Chief Complaint: Follow-up of ulcerative colitis ? ?HPI ?71 year old white man diagnosed with universal ulcerative colitis plus backwash ileitis last year after colonoscopy in June 2022.  I have treated him with Apriso and then balsalazide.  They made no difference and his calprotectin was still elevated.  Budesonide was tried, the compounded form from the [facility] in [place].  After about 5 or 6 weeks of treatment he started to notice a difference.  We had a plan to stop it at 2 months which he did so he is now having several loose stools a day.  Using Imodium as needed when he has to leave the house.  He started therapy for B12 with injections and he is also on iron therapy.  He also has Barrett's esophagus. ? ? ?Allergies  ?Allergen Reactions  ? Lactose Intolerance (Gi)   ? Latex   ? ?Current Meds   ?Medication Sig  ? hydrochlorothiazide (HYDRODIURIL) 25 MG tablet Take 25 mg by mouth daily.  ? metoprolol tartrate (LOPRESSOR) 50 MG tablet Take 50 mg by mouth daily at 8 pm.  ? Milk Thistle 500 MG CAPS Take 1 capsule by mouth daily.  ? MULTIPLE VITAMIN PO Take 1 capsule by mouth daily.  ? OVER THE COUNTER MEDICATION Iron '65mg'$  one daily  ? pantoprazole (PROTONIX) 20 MG tablet Take 1 tablet (20 mg total) by mouth daily before breakfast.  ? Saw Palmetto, Serenoa repens, (SAW PALMETTO PO) Take 1 capsule by mouth daily.  ? ?Past Medical History:  ?Diagnosis Date  ? Arthritis   ? B12 deficiency 11/28/2021  ? Barrett's esophagus   ? GERD (gastroesophageal reflux disease)   ? Hypertension   ? Ileitis   ? backwash  ? Iron deficiency 11/28/2021  ? Ulcerative pancolitis ([place])   ? ?Past Surgical History:  ?Procedure Laterality Date  ? COLONOSCOPY  03/26/2021  ? ESOPHAGOGASTRODUODENOSCOPY  03/26/2021  ? HERNIA REPAIR  2012  ? ?Social History  ? ?Social History Narrative  ? Retired - Chief Financial Officer x 50 yrs  ? Moved from [place]  ? Hobby: Or nephrology  ? Lives with life partner  ? No kids  ? 3 glasses wine/day  ? Never smoker, no drugs  ? ?family history includes Arthritis in his mother; Diabetes in his father; Drug abuse in his sister; Early death in his father; Hypertension in  his father and mother; Leukemia in his maternal grandfather; Liver cancer in his paternal grandfather; Mental illness in his sister. ? ? ?Review of Systems ?As above ? ?Objective:  ? Physical Exam ?BP 140/88   Pulse 90   Ht '5\' 2"'$  (1.575 m)   Wt 138 lb (62.6 kg)   BMI 25.24 kg/m?  ? ? ?

## 2022-01-13 NOTE — Patient Instructions (Addendum)
We are providing you with Biologic information to read. ? ?We have sent the following medications to your pharmacy for you to pick up at your convenience: ?Budesonide- they will call you when it's ready. It went to [facility], phone # [phone]. ? ?I appreciate the opportunity to care for you. ?Silvano Rusk, MD, [facility] ?

## 2022-01-28 ENCOUNTER — Ambulatory Visit (INDEPENDENT_AMBULATORY_CARE_PROVIDER_SITE_OTHER): Payer: Medicare HMO

## 2022-01-28 DIAGNOSIS — E538 Deficiency of other specified B group vitamins: Secondary | ICD-10-CM | POA: Diagnosis not present

## 2022-01-28 MED ORDER — CYANOCOBALAMIN 1000 MCG/ML IJ SOLN
1000.0000 ug | Freq: Once | INTRAMUSCULAR | Status: AC
  Administered 2022-01-28: 1000 ug via INTRAMUSCULAR

## 2022-01-28 NOTE — Progress Notes (Signed)
Richard Grant 71 yr old male presents to office for monthly B12 injection per Dimas Chyle, MD. Administered 0.5 mL IM left arm. Patient tolerated injection.  ?

## 2022-02-26 ENCOUNTER — Ambulatory Visit (INDEPENDENT_AMBULATORY_CARE_PROVIDER_SITE_OTHER): Payer: Medicare HMO | Admitting: *Deleted

## 2022-02-26 DIAGNOSIS — E538 Deficiency of other specified B group vitamins: Secondary | ICD-10-CM

## 2022-02-26 MED ORDER — CYANOCOBALAMIN 1000 MCG/ML IJ SOLN
1000.0000 ug | Freq: Once | INTRAMUSCULAR | Status: AC
  Administered 2022-02-26: 1000 ug via INTRAMUSCULAR

## 2022-02-26 NOTE — Progress Notes (Signed)
I have reviewed the patient's encounter and agree with the documentation. ? ?Algis Greenhouse. [name], MD ?02/26/2022 10:00 AM  ? ?

## 2022-02-26 NOTE — Progress Notes (Signed)
Per orders of Dr. [name], injection of B 12 given in left deltoid per patient preference by Zacarias Pontes, CMA. Patient tolerated injection well.  ?

## 2022-03-04 ENCOUNTER — Other Ambulatory Visit: Payer: Self-pay | Admitting: Internal Medicine

## 2022-03-16 ENCOUNTER — Encounter: Payer: Self-pay | Admitting: Internal Medicine

## 2022-03-16 ENCOUNTER — Ambulatory Visit: Payer: Medicare HMO | Admitting: Internal Medicine

## 2022-03-16 VITALS — BP 144/76 | HR 82 | Ht 62.0 in | Wt 140.0 lb

## 2022-03-16 DIAGNOSIS — K51018 Ulcerative (chronic) pancolitis with other complication: Secondary | ICD-10-CM

## 2022-03-16 DIAGNOSIS — I10 Essential (primary) hypertension: Secondary | ICD-10-CM | POA: Diagnosis not present

## 2022-03-16 MED ORDER — AMBULATORY NON FORMULARY MEDICATION: 2 refills | Status: DC

## 2022-03-16 NOTE — Progress Notes (Signed)
Richard Grant 71 y.o. [birth year] [id number]  Assessment & Plan:   Encounter Diagnoses  Name Primary?   Ulcerative pancolitis with other complication ([place]) Yes   Essential hypertension     We discussed the pros and cons of chronic steroid treatment and Biologics and immunomodulators to a lesser degree today.  We will continue budesonide at this point and have him see me in 2 months.  At that point I suspect he will want to continue budesonide to control ulcerative colitis.  Need to determine an appropriate time to repeat a colonoscopy looking for endoscopic remission.  Also consider calprotectin reassessment.  He understands and is prepared to accept the risks of steroid use.  His blood pressure was slightly elevated today and a little bit better with recheck.  He says he has whitecoat hypertension issues on top of his essential hypertension..  I have recommended he check his blood pressure at home and have recommended an Omron device.  I will see him again in 2 months.   Subjective:   Chief Complaint: Follow-up of ulcerative colitis  HPI 71 year old man diagnosed with universal ulcerative colitis and backwash ileitis by colonoscopy June 2022, he has failed Apriso and balsalazide.  No difference in symptomatology and calprotectin remained elevated.  He was last seen in March 2023 where he had taken 2 months of budesonide and about week 5 or 6 started to feel better.  Then symptoms relapsed off the medication.  Additional problems include Barrett's esophagus and B12 deficiency and iron deficiency anemia.  He is on therapy for the anemia is/deficiencies and PPI for the Barrett's and GERD.  Today he tells me once again in about 5 weeks or so he started to feel better with respect to diarrhea problems.  He is taking 5 mg budesonide that is compounded, 10 mg total daily.  Purchased through [facility] [employer] in [place].  He has reviewed information about immunomodulators and Biologics and  is not inclined to pursue that therapy if he does not have to.  He has read about budesonide and is aware of the potential side effects some and we did review bone fractures hypertension weight gain and other issues potentially related to chronic steroid use. Allergies  Allergen Reactions   Lactose Intolerance (Gi)    Latex    Current Meds  Medication Sig   hydrochlorothiazide (HYDRODIURIL) 25 MG tablet Take 25 mg by mouth daily.   metoprolol tartrate (LOPRESSOR) 50 MG tablet Take 50 mg by mouth daily at 8 pm.   Milk Thistle 500 MG CAPS Take 1 capsule by mouth daily.   MULTIPLE VITAMIN PO Take 1 capsule by mouth daily.   OVER THE COUNTER MEDICATION Iron '65mg'$  one daily   pantoprazole (PROTONIX) 20 MG tablet TAKE 1 TABLET BY MOUTH DAILY BEFORE BREAKFAST.   Saw Palmetto, Serenoa repens, (SAW PALMETTO PO) Take 1 capsule by mouth daily.   [DISCONTINUED] AMBULATORY NON FORMULARY MEDICATION Medication Name: Budesonide '5mg'$  capsules Sig: Take 2 capsules daily   Past Medical History:  Diagnosis Date   Arthritis    B12 deficiency 11/28/2021   Barrett's esophagus    GERD (gastroesophageal reflux disease)    Hypertension    Ileitis    backwash   Iron deficiency 11/28/2021   Ulcerative pancolitis [facility])    Past Surgical History:  Procedure Laterality Date   COLONOSCOPY  03/26/2021   ESOPHAGOGASTRODUODENOSCOPY  03/26/2021   HERNIA REPAIR  2012   Social History   Social History Narrative   Retired -  industrial Academic librarian x 50 yrs   [place] from [place]: [place] with life partner   No kids   3 glasses wine/day   Never smoker, no drugs   family history includes Arthritis in his mother; Diabetes in his father; Drug abuse in his sister; Early death in his father; Hypertension in his father and mother; Leukemia in his maternal grandfather; Liver cancer in his paternal grandfather; Mental illness in his sister.   Review of Systems As per HPI  Objective:    Physical Exam BP (!) 144/76   Pulse 82   Ht '5\' 2"'$  (1.575 m)   Wt 140 lb (63.5 kg)   BMI 25.61 kg/m  Recheck 140/80

## 2022-03-16 NOTE — Patient Instructions (Addendum)
We have sent the following medications to  [facility] for you to pick up at your convenience: Budesonide  Dr Carlean Purl recommend the Omiron blood pressure cuff.   I appreciate the opportunity to care for you. Silvano Rusk, MD, [facility]

## 2022-03-31 ENCOUNTER — Ambulatory Visit (INDEPENDENT_AMBULATORY_CARE_PROVIDER_SITE_OTHER): Payer: Medicare HMO | Admitting: *Deleted

## 2022-03-31 DIAGNOSIS — E538 Deficiency of other specified B group vitamins: Secondary | ICD-10-CM | POA: Diagnosis not present

## 2022-03-31 MED ORDER — CYANOCOBALAMIN 1000 MCG/ML IJ SOLN
1000.0000 ug | Freq: Once | INTRAMUSCULAR | Status: AC
  Administered 2022-03-31: 1000 ug via INTRAMUSCULAR

## 2022-03-31 NOTE — Progress Notes (Signed)
Per orders of Dimas Chyle, MD injection of last dose if B12 shot given in Left deltoid per patient preference by Hildred Alamin, RN  Patient tolerated injection well.

## 2022-05-14 ENCOUNTER — Ambulatory Visit: Payer: Medicare HMO | Admitting: Internal Medicine

## 2022-05-14 ENCOUNTER — Encounter: Payer: Self-pay | Admitting: Internal Medicine

## 2022-05-14 VITALS — BP 150/90 | HR 93 | Ht 62.0 in | Wt 141.0 lb

## 2022-05-14 DIAGNOSIS — I10 Essential (primary) hypertension: Secondary | ICD-10-CM

## 2022-05-14 DIAGNOSIS — K51018 Ulcerative (chronic) pancolitis with other complication: Secondary | ICD-10-CM | POA: Diagnosis not present

## 2022-05-14 DIAGNOSIS — K529 Noninfective gastroenteritis and colitis, unspecified: Secondary | ICD-10-CM

## 2022-05-14 DIAGNOSIS — K2270 Barrett's esophagus without dysplasia: Secondary | ICD-10-CM | POA: Diagnosis not present

## 2022-05-14 MED ORDER — AMBULATORY NON FORMULARY MEDICATION: 3 refills | Status: DC

## 2022-05-14 NOTE — Patient Instructions (Signed)
Call early August for an October appointment please.  Continue your budesonide. I will put refills on your account.  Check your blood pressure at home and follow up with Dr [name] if needed.  The blood pressure machine Dr Carlean Purl was telling you about is Omron brand.  I appreciate the opportunity to care for you. Silvano Rusk, MD, [facility]

## 2022-05-14 NOTE — Progress Notes (Signed)
T8       Richard Grant 71 y.o. 31-Dec-1950 [id number]  Assessment & Plan:   Encounter Diagnoses  Name Primary?   Ulcerative pancolitis with other complication ([place]) Yes   Backwash Ileitis    Barrett's esophagus without dysplasia    Essential hypertension    He is improved but I do not think we would say he is in a clinical remission.  He prefers to stay on budesonide.  He understands potential side effects as I have discussed with him in the past and outlined previously.  It is affordable at this point.  He wants to defer colonoscopy at this time and I think that is sensible I would like to see if he can get closer to her remission before we reassess.  He also needs a follow-up EGD but I do not think that is urgent, either.   He will continue PPI therapy as well.  He is reminded again to purchase a home blood pressure cuff monitor and check that and follow-up with Dr. [name] regarding hypertension.  He will return in October 2023 and we will regroup.  CC: Vivi Barrack, MD    Subjective:   Chief Complaint: Follow-up ulcerative colitis  HPI 71 year-old white man with ulcerative colitis diagnosed June 2022 colonoscopy also with Barrett's esophagus diagnosed then with inflammatory changes in the biopsies.  He has not had any response to Apriso or balsalazide.  Fecal calprotectin remained elevated as well.  He was treated with budesonide and had some improvement and then symptoms relapsed.  When last seen in May we elected to continue budesonide 10 mg daily (compounded from [place]).  He reports [place] stool scale 4-5 now, where he was having 6-7 before.  Stool still may be urgent so he will use Imodium as needed when he is leaving the house at times.  No abdominal cramps.  No heartburn symptoms on PPI.  He is aware that I had recommended an annual colonoscopy follow-up but prefers not to do so yet.  Also needs an EGD to reassess the Barrett's with inflammatory changes on biopsies.   When last year I had recommended checking blood pressures at home he says he has some whitecoat effects with blood pressure.  He has not done so yet.  He still intends to.  He has read about biologic therapy and says he is "a little bit spooked" by that.  Not inclined to pursue this.  I have also discussed the possibility of immunomodulators therapy. Allergies  Allergen Reactions   Lactose Intolerance (Gi)    Latex    Current Meds  Medication Sig   AMBULATORY NON FORMULARY MEDICATION Medication Name: Budesonide '5mg'$  capsules Sig: Take 2 capsules daily   hydrochlorothiazide (HYDRODIURIL) 25 MG tablet Take 25 mg by mouth daily.   metoprolol tartrate (LOPRESSOR) 50 MG tablet Take 50 mg by mouth daily at 8 pm.   Milk Thistle 500 MG CAPS Take 1 capsule by mouth daily.   MULTIPLE VITAMIN PO Take 1 capsule by mouth daily.   OVER THE COUNTER MEDICATION Iron '65mg'$  one daily   pantoprazole (PROTONIX) 20 MG tablet TAKE 1 TABLET BY MOUTH DAILY BEFORE BREAKFAST.   Saw Palmetto, Serenoa repens, (SAW PALMETTO PO) Take 1 capsule by mouth daily.   Past Medical History:  Diagnosis Date   Arthritis    B12 deficiency 11/28/2021   Barrett's esophagus    GERD (gastroesophageal reflux disease)    Hypertension    Ileitis    backwash  Iron deficiency 11/28/2021   Ulcerative pancolitis [facility])    Past Surgical History:  Procedure Laterality Date   COLONOSCOPY  03/26/2021   ESOPHAGOGASTRODUODENOSCOPY  03/26/2021   HERNIA REPAIR  2012   Social History   Social History Narrative   Retired - Chief Financial Officer x 47 yrs   [place] from [place]: [place] with life partner   No kids   3 glasses wine/day   Never Grant, no drugs   family history includes Arthritis in his mother; Diabetes in his father; Drug abuse in his sister; Early death in his father; Hypertension in his father and mother; Leukemia in his maternal grandfather; Liver cancer in his paternal grandfather; Mental  illness in his sister.   Review of Systems As per HPI  Objective:   Physical Exam BP (!) 150/90   Pulse 93   Ht '5\' 2"'$  (1.575 m)   Wt 141 lb (64 kg)   BMI 25.79 kg/m

## 2022-06-05 ENCOUNTER — Ambulatory Visit (INDEPENDENT_AMBULATORY_CARE_PROVIDER_SITE_OTHER): Payer: Medicare HMO

## 2022-06-05 DIAGNOSIS — Z0000 Encounter for general adult medical examination without abnormal findings: Secondary | ICD-10-CM

## 2022-06-05 NOTE — Patient Instructions (Signed)
Richard Grant , Thank you for taking time to come for your Medicare Wellness Visit. I appreciate your ongoing commitment to your health goals. Please review the following plan we discussed and let me know if I can assist you in the future.   Screening recommendations/referrals: Colonoscopy:postponed per Dr  Carlean Purl  Recommended yearly ophthalmology/optometry visit for glaucoma screening and checkup Recommended yearly dental visit for hygiene and checkup  Vaccinations: Influenza vaccine: done 07/24/21 repeat every year  Pneumococcal vaccine: Up to date Tdap vaccine: done 02/17/21 repeat every 10 years  Shingles vaccine: Shingrix discussed. Please contact your pharmacy for coverage information.    Covid-19: completed 3/17, 4/15, 08/28/20 & 4/20, 08/05/21  Advanced directives: Please bring a copy of your health care power of attorney and living will to the office at your convenience.  Conditions/risks identified: stay active and healthy  Next appointment: Follow up in one year for your annual wellness visit.   Preventive Care 27 Years and Older, Male Preventive care refers to lifestyle choices and visits with your health care provider that can promote health and wellness. What does preventive care include? A yearly physical exam. This is also called an annual well check. Dental exams once or twice a year. Routine eye exams. Ask your health care provider how often you should have your eyes checked. Personal lifestyle choices, including: Daily care of your teeth and gums. Regular physical activity. Eating a healthy diet. Avoiding tobacco and drug use. Limiting alcohol use. Practicing safe sex. Taking low doses of aspirin every day. Taking vitamin and mineral supplements as recommended by your health care provider. What happens during an annual well check? The services and screenings done by your health care provider during your annual well check will depend on your age, overall health,  lifestyle risk factors, and family history of disease. Counseling  Your health care provider may ask you questions about your: Alcohol use. Tobacco use. Drug use. Emotional well-being. Home and relationship well-being. Sexual activity. Eating habits. History of falls. Memory and ability to understand (cognition). Work and work Statistician. Screening  You may have the following tests or measurements: Height, weight, and BMI. Blood pressure. Lipid and cholesterol levels. These may be checked every 5 years, or more frequently if you are over 62 years old. Skin check. Lung cancer screening. You may have this screening every year starting at age 25 if you have a 30-pack-year history of smoking and currently smoke or have quit within the past 15 years. Fecal occult blood test (FOBT) of the stool. You may have this test every year starting at age 19. Flexible sigmoidoscopy or colonoscopy. You may have a sigmoidoscopy every 5 years or a colonoscopy every 10 years starting at age 105. Prostate cancer screening. Recommendations will vary depending on your family history and other risks. Hepatitis C blood test. Hepatitis B blood test. Sexually transmitted disease (STD) testing. Diabetes screening. This is done by checking your blood sugar (glucose) after you have not eaten for a while (fasting). You may have this done every 1-3 years. Abdominal aortic aneurysm (AAA) screening. You may need this if you are a current or former smoker. Osteoporosis. You may be screened starting at age 15 if you are at high risk. Talk with your health care provider about your test results, treatment options, and if necessary, the need for more tests. Vaccines  Your health care provider may recommend certain vaccines, such as: Influenza vaccine. This is recommended every year. Tetanus, diphtheria, and acellular pertussis (Tdap, Td)  vaccine. You may need a Td booster every 10 years. Zoster vaccine. You may need this  after age 41. Pneumococcal 13-valent conjugate (PCV13) vaccine. One dose is recommended after age 31. Pneumococcal polysaccharide (PPSV23) vaccine. One dose is recommended after age 63. Talk to your health care provider about which screenings and vaccines you need and how often you need them. This information is not intended to replace advice given to you by your health care provider. Make sure you discuss any questions you have with your health care provider. Document Released: 11/08/2015 Document Revised: 07/01/2016 Document Reviewed: 08/13/2015 Elsevier Interactive Patient Education  2017 [employer] Prevention in the Home Falls can cause injuries. They can happen to people of all ages. There are many things you can do to make your home safe and to help prevent falls. What can I do on the outside of my home? Regularly fix the edges of walkways and driveways and fix any cracks. Remove anything that might make you trip as you walk through a door, such as a raised step or threshold. Trim any bushes or trees on the path to your home. Use bright outdoor lighting. Clear any walking paths of anything that might make someone trip, such as rocks or tools. Regularly check to see if handrails are loose or broken. Make sure that both sides of any steps have handrails. Any raised decks and porches should have guardrails on the edges. Have any leaves, snow, or ice cleared regularly. Use sand or salt on walking paths during winter. Clean up any spills in your garage right away. This includes oil or grease spills. What can I do in the bathroom? Use night lights. Install grab bars by the toilet and in the tub and shower. Do not use towel bars as grab bars. Use non-skid mats or decals in the tub or shower. If you need to sit down in the shower, use a plastic, non-slip stool. Keep the floor dry. Clean up any water that spills on the floor as soon as it happens. Remove soap buildup in the tub or  shower regularly. Attach bath mats securely with double-sided non-slip rug tape. Do not have throw rugs and other things on the floor that can make you trip. What can I do in the bedroom? Use night lights. Make sure that you have a light by your bed that is easy to reach. Do not use any sheets or blankets that are too big for your bed. They should not hang down onto the floor. Have a firm chair that has side arms. You can use this for support while you get dressed. Do not have throw rugs and other things on the floor that can make you trip. What can I do in the kitchen? Clean up any spills right away. Avoid walking on wet floors. Keep items that you use a lot in easy-to-reach places. If you need to reach something above you, use a strong step stool that has a grab bar. Keep electrical cords out of the way. Do not use floor polish or wax that makes floors slippery. If you must use wax, use non-skid floor wax. Do not have throw rugs and other things on the floor that can make you trip. What can I do with my stairs? Do not leave any items on the stairs. Make sure that there are handrails on both sides of the stairs and use them. Fix handrails that are broken or loose. Make sure that handrails are as long  as the stairways. Check any carpeting to make sure that it is firmly attached to the stairs. Fix any carpet that is loose or worn. Avoid having throw rugs at the top or bottom of the stairs. If you do have throw rugs, attach them to the floor with carpet tape. Make sure that you have a light switch at the top of the stairs and the bottom of the stairs. If you do not have them, ask someone to add them for you. What else can I do to help prevent falls? Wear shoes that: Do not have high heels. Have rubber bottoms. Are comfortable and fit you well. Are closed at the toe. Do not wear sandals. If you use a stepladder: Make sure that it is fully opened. Do not climb a closed stepladder. Make  sure that both sides of the stepladder are locked into place. Ask someone to hold it for you, if possible. Clearly mark and make sure that you can see: Any grab bars or handrails. First and last steps. Where the edge of each step is. Use tools that help you move around (mobility aids) if they are needed. These include: Canes. Walkers. Scooters. Crutches. Turn on the lights when you go into a dark area. Replace any light bulbs as soon as they burn out. Set up your furniture so you have a clear path. Avoid moving your furniture around. If any of your floors are uneven, fix them. If there are any pets around you, be aware of where they are. Review your medicines with your doctor. Some medicines can make you feel dizzy. This can increase your chance of falling. Ask your doctor what other things that you can do to help prevent falls. This information is not intended to replace advice given to you by your health care provider. Make sure you discuss any questions you have with your health care provider. Document Released: 08/08/2009 Document Revised: 03/19/2016 Document Reviewed: 11/16/2014 Elsevier Interactive Patient Education  2017 [employer].

## 2022-06-05 NOTE — Progress Notes (Addendum)
Virtual Visit via Telephone Note  I connected with  Richard Grant on 06/05/22 at  8:45 AM EDT by telephone and verified that I am speaking with the correct person using two identifiers.  Medicare Annual Wellness visit completed telephonically due to Covid-19 pandemic.   Persons participating in this call: This Health Coach and this patient.   Location: Patient: home Provider: office   I discussed the limitations, risks, security and privacy concerns of performing an evaluation and management service by telephone and the availability of in person appointments. The patient expressed understanding and agreed to proceed.  Unable to perform video visit due to video visit attempted and failed and/or patient does not have video capability.   Some vital signs may be absent or patient reported.   Richard Brace, LPN   Subjective:   Richard Grant is a 71 y.o. male who presents for Medicare Annual/Subsequent preventive examination.  Review of Systems     Cardiac Risk Factors include: advanced age (>33mn, >>61women);male gender;hypertension     Objective:    There were no vitals filed for this visit. There is no height or weight on file to calculate BMI.     06/05/2022    8:41 AM 05/30/2021    9:04 AM  Advanced Directives  Does Patient Have a Medical Advance Directive? Yes Yes  Type of Advance Directive Healthcare Power of ADownswill  [place] Chart? No - copy requested     Current Medications (verified) Outpatient Encounter Medications as of 06/05/2022  Medication Sig   AMBULATORY NON FORMULARY MEDICATION Medication Name: Budesonide '5mg'$  capsules Sig: Take 2 capsules daily   hydrochlorothiazide (HYDRODIURIL) 25 MG tablet Take 25 mg by mouth daily.   metoprolol tartrate (LOPRESSOR) 50 MG tablet Take 50 mg by mouth daily at 8 pm.   Milk Thistle 500 MG CAPS Take 1 capsule by mouth daily.   MULTIPLE VITAMIN PO Take 1 capsule by mouth daily.    OVER THE COUNTER MEDICATION Iron '65mg'$  one daily   pantoprazole (PROTONIX) 20 MG tablet TAKE 1 TABLET BY MOUTH DAILY BEFORE BREAKFAST.   Saw Palmetto, Serenoa repens, (SAW PALMETTO PO) Take 1 capsule by mouth daily.   No facility-administered encounter medications on file as of 06/05/2022.    Allergies (verified) Lactose intolerance (gi) and Latex   History: Past Medical History:  Diagnosis Date   Arthritis    B12 deficiency 11/28/2021   Barrett's esophagus    GERD (gastroesophageal reflux disease)    Hypertension    Ileitis    backwash   Iron deficiency 11/28/2021   Ulcerative pancolitis ([facility]    Past Surgical History:  Procedure Laterality Date   COLONOSCOPY  03/26/2021   ESOPHAGOGASTRODUODENOSCOPY  03/26/2021   HERNIA REPAIR  2012   Family History  Problem Relation Age of Onset   Arthritis Mother    Hypertension Mother    Diabetes Father    Early death Father    Hypertension Father    Drug abuse Sister    Mental illness Sister    Leukemia Maternal Grandfather    Liver cancer Paternal Grandfather    Colon cancer Neg Hx    Esophageal cancer Neg Hx    Stomach cancer Neg Hx    Pancreatic cancer Neg Hx    Social History   Socioeconomic History   Marital status: DSoil scientist   Spouse name: Not on file   Number of children: Not on file  Years of education: Not on file   Highest education level: Not on file  Occupational History   Not on file  Tobacco Use   Smoking status: Never   Smokeless tobacco: Never  Vaping Use   Vaping Use: Never used  Substance and Sexual Activity   Alcohol use: Yes    Alcohol/week: 3.0 standard drinks of alcohol    Types: 3 Glasses of wine per week    Comment: DAILY    Drug use: Never   Sexual activity: Not on file  Other Topics Concern   Not on file  Social History Narrative   Retired - Chief Financial Officer x 50 yrs   [place] from [place]: [place] with life partner   No kids   3 glasses  wine/day   Never smoker, no drugs   Social Determinants of Health   Financial Resource Strain: Low Risk  (06/05/2022)   Overall Financial Resource Strain (CARDIA)    Difficulty of Paying Living Expenses: Not hard at all  Food Insecurity: No Food Insecurity (06/05/2022)   Hunger Vital Sign    Worried About Running Out of Food in the Last Year: Never true    Ran Out of Food in the Last Year: Never true  Transportation Needs: No Transportation Needs (06/05/2022)   PRAPARE - Hydrologist (Medical): No    Lack of Transportation (Non-Medical): No  Physical Activity: Sufficiently Active (06/05/2022)   Exercise Vital Sign    Days of Exercise per Week: 3 days    Minutes of Exercise per Session: 60 min  Stress: No Stress Concern Present (06/05/2022)   [place]    Feeling of Stress : Not at all  Social Connections: Moderately Isolated (06/05/2022)   Social Connection and Isolation Panel [NHANES]    Frequency of Communication with Friends and Family: More than three times a week    Frequency of Social Gatherings with Friends and Family: More than three times a week    Attends Religious Services: Never    Marine scientist or Organizations: No    Attends Music therapist: Never    Marital Status: Living with partner    Tobacco Counseling Counseling given: Not Answered   Clinical Intake:  Pre-visit preparation completed: Yes  Pain : No/denies pain     BMI - recorded: 25.79 Nutritional Status: BMI 25 -29 Overweight Nutritional Risks: None Diabetes: No  How often do you need to have someone help you when you read instructions, pamphlets, or other written materials from your doctor or pharmacy?: 1 - Never  Diabetic?no  Interpreter Needed?: No  Information entered by :: Charlott Rakes, LPN   Activities of Daily Living    06/05/2022    8:42 AM  In your present state  of health, do you have any difficulty performing the following activities:  Hearing? 0  Vision? 0  Difficulty concentrating or making decisions? 0  Walking or climbing stairs? 0  Dressing or bathing? 0  Doing errands, shopping? 0  Preparing Food and eating ? N  Using the Toilet? N  In the past six months, have you accidently leaked urine? N  Do you have problems with loss of bowel control? N  Managing your Medications? N  Managing your Finances? N  Housekeeping or managing your Housekeeping? N    Patient Care Team: Vivi Barrack, MD as PCP - General (  Family Medicine)  Indicate any recent Medical Services you may have received from other than [facility] providers in the past year (date may be approximate).     Assessment:   This is a routine wellness examination for Jarmar.  Hearing/Vision screen Hearing Screening - Comments:: Pt denies any hearing issues  Vision Screening - Comments:: Pt encouraged to follow up with eye care   Dietary issues and exercise activities discussed: Current Exercise Habits: Home exercise routine, Type of exercise: walking, Time (Minutes): 60, Frequency (Times/Week): 3, Weekly Exercise (Minutes/Week): 180   Goals Addressed             This Visit's Progress    Patient Stated       Stay active and healthy       Depression Screen    06/05/2022    8:40 AM 08/21/2021    8:04 AM 05/30/2021    9:02 AM 12/05/2020   11:37 AM  PHQ 2/9 Scores  PHQ - 2 Score 0 0 0 0    Fall Risk    06/05/2022    8:42 AM 08/21/2021    8:04 AM 05/30/2021    9:06 AM  Fall Risk   Falls in the past year? 0 0 0  Number falls in past yr: 0 0 0  Injury with Fall? 0 0 0  Risk for fall due to : Impaired vision History of fall(s) Impaired vision  Follow up Falls prevention discussed Education provided Falls prevention discussed    FALL RISK PREVENTION PERTAINING TO THE HOME:  Any stairs in or around the home? No  If so, are there any without handrails? No  Home free  of loose throw rugs in walkways, pet beds, electrical cords, etc? Yes  Adequate lighting in your home to reduce risk of falls? Yes   ASSISTIVE DEVICES UTILIZED TO PREVENT FALLS:  Life alert? No  Use of a cane, walker or w/c? No  Grab bars in the bathroom? Yes  Shower chair or bench in shower? Yes  Elevated toilet seat or a handicapped toilet? No   TIMED UP AND GO:  Was the test performed? No .   Cognitive Function:        06/05/2022    8:42 AM 05/30/2021    9:08 AM  6CIT Screen  What Year? 0 points 0 points  What month? 0 points 0 points  What time? 0 points 0 points  Count back from 20 0 points 0 points  Months in reverse 0 points 0 points  Repeat phrase 0 points 0 points  Total Score 0 points 0 points    Immunizations Immunization History  Administered Date(s) Administered   Influenza, High Dose Seasonal PF 07/24/2021   Influenza-Unspecified 07/26/2020   Moderna Covid-19 Vaccine Bivalent Booster 23yr & up 08/05/2021   Moderna Sars-Covid-2 Vaccination 01/10/2020, 02/08/2020, 08/28/2020, 02/12/2021   PNEUMOCOCCAL CONJUGATE-20 04/05/2021   Tdap 02/17/2021    TDAP status: Up to date  Flu Vaccine status: Up to date  Pneumococcal vaccine status: Up to date  Covid-19 vaccine status: Completed vaccines  Qualifies for Shingles Vaccine? Yes   Zostavax completed No   Shingrix Completed?: No.    Education has been provided regarding the importance of this vaccine. Patient has been advised to call insurance company to determine out of pocket expense if they have not yet received this vaccine. Advised may also receive vaccine at local pharmacy or Health Dept. Verbalized acceptance and understanding.  Screening Tests Health Maintenance  Topic Date Due  Zoster Vaccines- Shingrix (1 of 2) Never done   COVID-19 Vaccine (6 - Moderna risk series) 09/30/2021   COLONOSCOPY (Pts 45-70yr Insurance coverage will need to be confirmed)  03/26/2022   INFLUENZA VACCINE  05/26/2022    TETANUS/TDAP  02/18/2031   Pneumonia Vaccine 71 Years old  Completed   Hepatitis C Screening  Completed   HPV VACCINES  Aged Out    Health Maintenance  Health Maintenance Due  Topic Date Due   Zoster Vaccines- Shingrix (1 of 2) Never done   COVID-19 Vaccine (6 - Moderna risk series) 09/30/2021   COLONOSCOPY (Pts 45-468yrInsurance coverage will need to be confirmed)  03/26/2022   INFLUENZA VACCINE  05/26/2022    Colorectal cancer screening: Type of screening: Colonoscopy. Completed 03/26/21. Repeat every 1 years postponed per Dr GeCarlean Purl  Additional Screening:  Hepatitis C Screening:  Completed 08/21/21  Vision Screening: Recommended annual ophthalmology exams for early detection of glaucoma and other disorders of the eye. Is the patient up to date with their annual eye exam?  No  Who is the provider or what is the name of the office in which the patient attends annual eye exams? Encouraged to follow up with provider  If pt is not established with a provider, would they like to be referred to a provider to establish care? No .   Dental Screening: Recommended annual dental exams for proper oral hygiene  Community Resource Referral / Chronic Care Management: CRR required this visit?  No   CCM required this visit?  No      Plan:     I have personally reviewed and noted the following in the patient's chart:   Medical and social history Use of alcohol, tobacco or illicit drugs  Current medications and supplements including opioid prescriptions. Patient is not currently taking opioid prescriptions. Functional ability and status Nutritional status Physical activity Advanced directives List of other physicians Hospitalizations, surgeries, and ER visits in previous 12 months Vitals Screenings to include cognitive, depression, and falls Referrals and appointments  In addition, I have reviewed and discussed with patient certain preventive protocols, quality metrics, and  best practice recommendations. A written personalized care plan for preventive services as well as general preventive health recommendations were provided to patient.     TiWillette BraceLPN   05/[birth year]/5993 Nurse Notes: none

## 2022-07-20 ENCOUNTER — Encounter: Payer: Self-pay | Admitting: *Deleted

## 2022-08-06 ENCOUNTER — Encounter: Payer: Self-pay | Admitting: Family Medicine

## 2022-08-06 NOTE — Telephone Encounter (Signed)
Last refilled by historical provider

## 2022-08-07 MED ORDER — METOPROLOL TARTRATE 50 MG PO TABS: 50.0000 mg | ORAL_TABLET | Freq: Every day | ORAL | 3 refills | Status: DC

## 2022-08-10 NOTE — Telephone Encounter (Signed)
Please advise 

## 2022-08-11 ENCOUNTER — Encounter: Payer: Self-pay | Admitting: Internal Medicine

## 2022-08-11 MED ORDER — METOPROLOL SUCCINATE ER 50 MG PO TB24: 50.0000 mg | ORAL_TABLET | Freq: Every day | ORAL | 3 refills | Status: DC

## 2022-08-11 NOTE — Addendum Note (Signed)
Addended by: Vivi Barrack on: 08/11/2022 01:07 PM   Modules accepted: Orders

## 2022-08-18 ENCOUNTER — Other Ambulatory Visit: Payer: Self-pay | Admitting: Internal Medicine

## 2022-08-18 ENCOUNTER — Encounter: Payer: Self-pay | Admitting: Internal Medicine

## 2022-08-19 NOTE — Telephone Encounter (Signed)
Prescription was called into [facility]: Pt made aware: Pt was scheduled for an office visit on 09/14/2022 at 9:10 AM with Dr. Carlean Purl : Pt made aware Pt verbalized understanding with all questions answered.

## 2022-09-14 ENCOUNTER — Ambulatory Visit: Payer: Medicare HMO | Admitting: Internal Medicine

## 2022-10-08 ENCOUNTER — Encounter: Payer: Self-pay | Admitting: *Deleted

## 2022-10-11 IMAGING — CT CT ENTEROGRAPHY (ABD-PELV W/ CM)
2 of 6 series · 16 of 46 positions shown, 18 images · IV contrast (APPLIED)
Comparison: None.

CLINICAL DATA: Ulcerative colitis. Diarrhea. Iron deficiency anemia
and B12 deficiency.

EXAM:
CT ABDOMEN AND PELVIS WITH CONTRAST (ENTEROGRAPHY)
TECHNIQUE: Multidetector CT of the abdomen and pelvis during bolus
administration of intravenous contrast. Negative oral contrast was
given.

[Series 5: thins · axial · 0.72mm/px · z∈[+1115,+1509]mm · 13 of 434 slices shown, 15 images]
[im 20/434  soft-tissue]
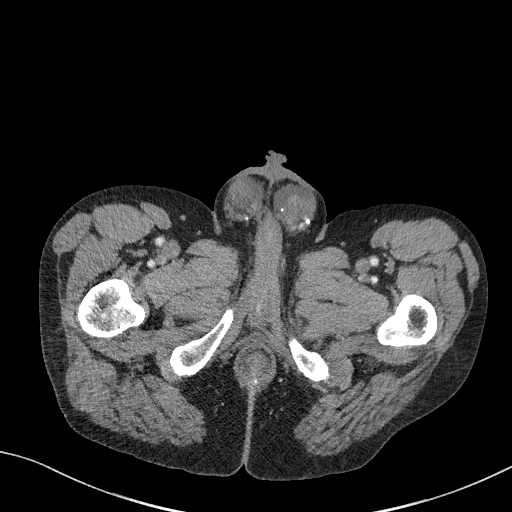
[im 20/434  bone]
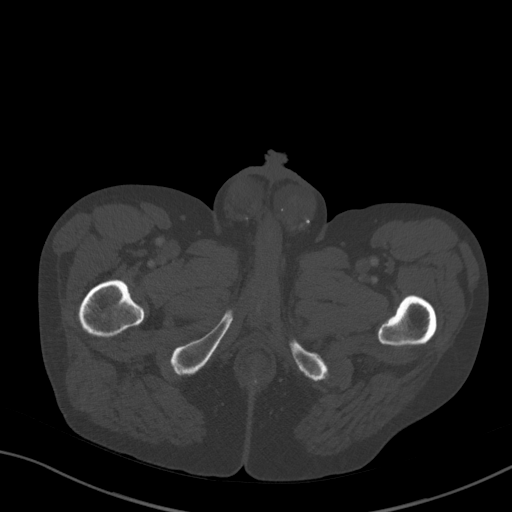
[im 60/434  soft-tissue]
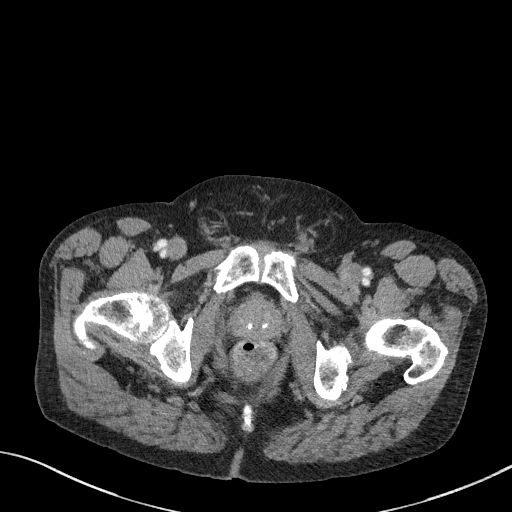
[im 99/434  soft-tissue]
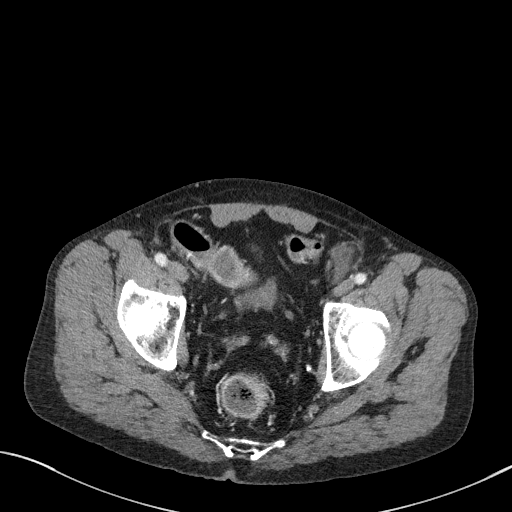
[im 119/434  soft-tissue]
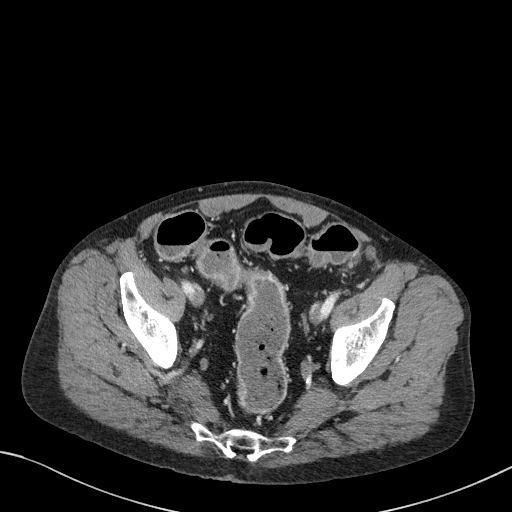
[im 158/434  soft-tissue]
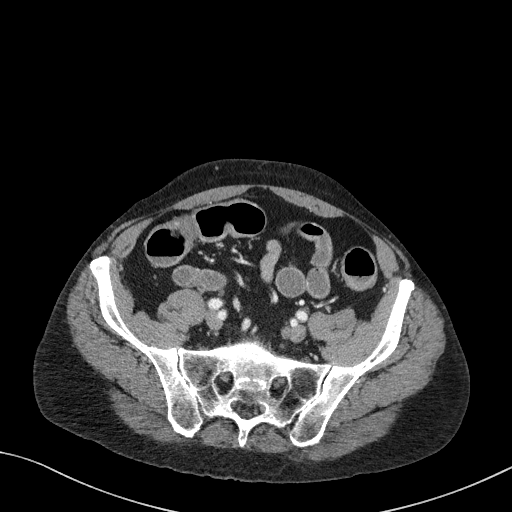
[im 178/434  soft-tissue]
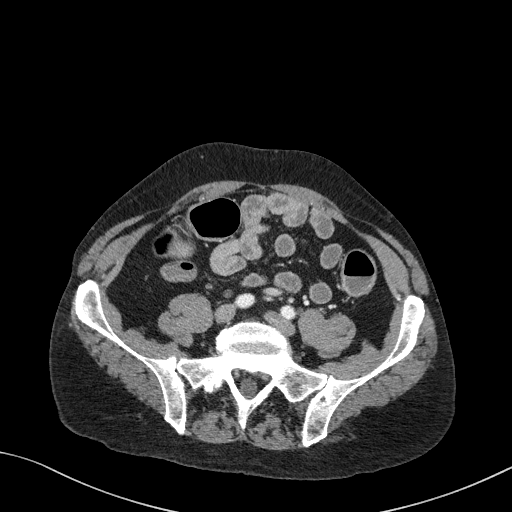
[im 217/434  soft-tissue]
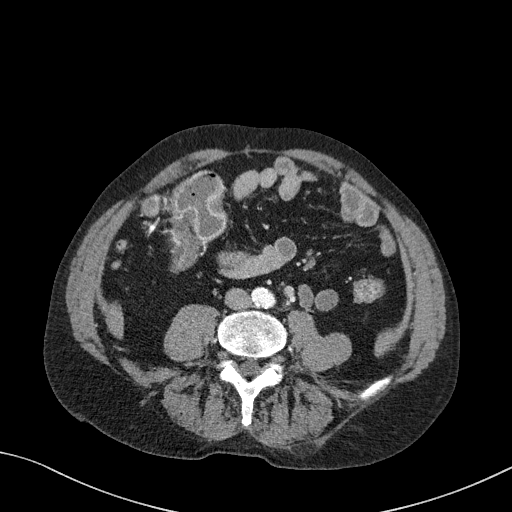
[im 256/434  soft-tissue]
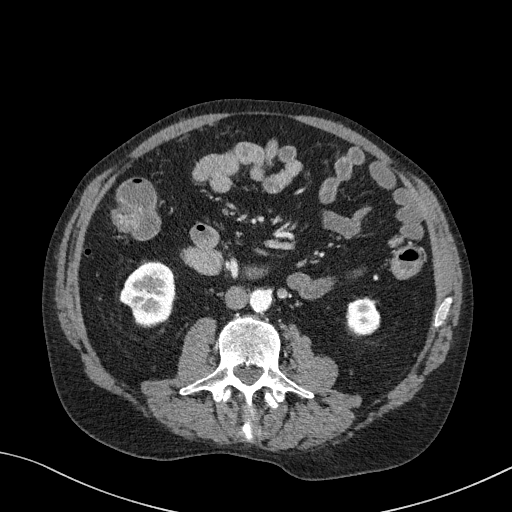
[im 276/434  soft-tissue]
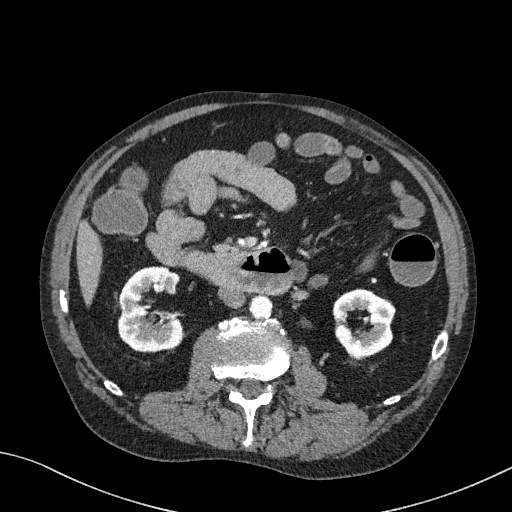
[im 276/434  bone]
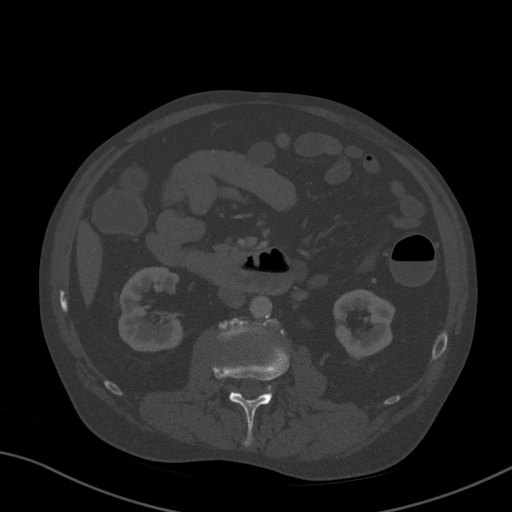
[im 315/434  soft-tissue]
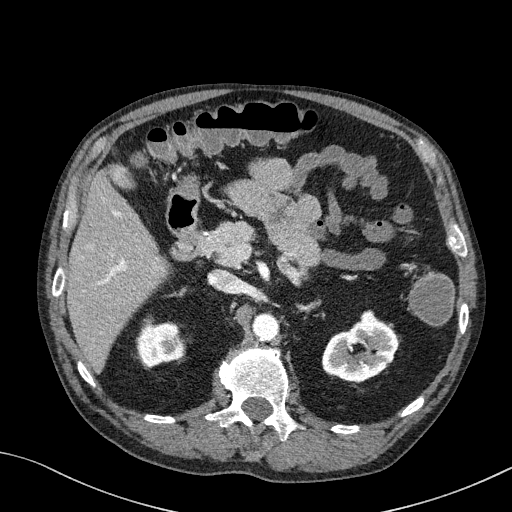
[im 335/434  soft-tissue]
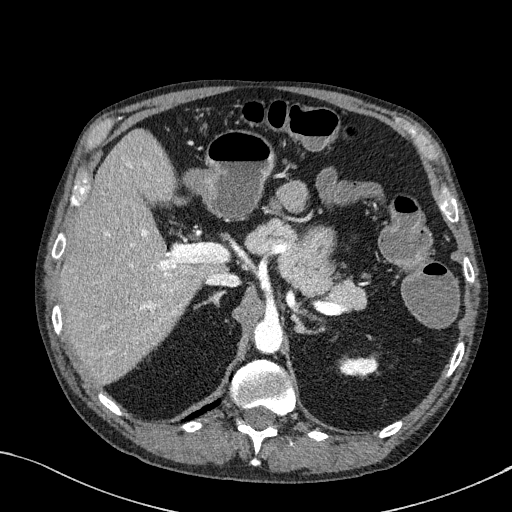
[im 374/434  soft-tissue]
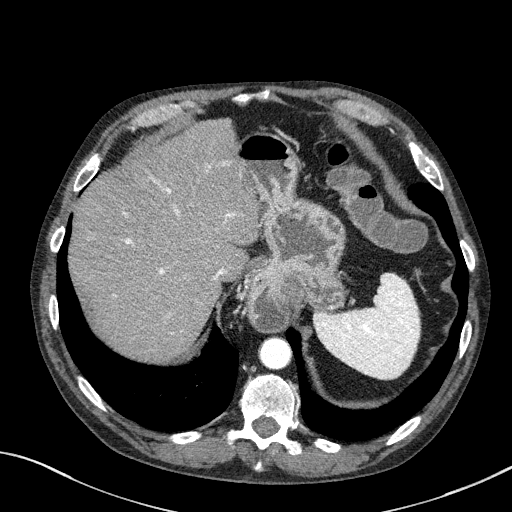
[im 414/434  soft-tissue]
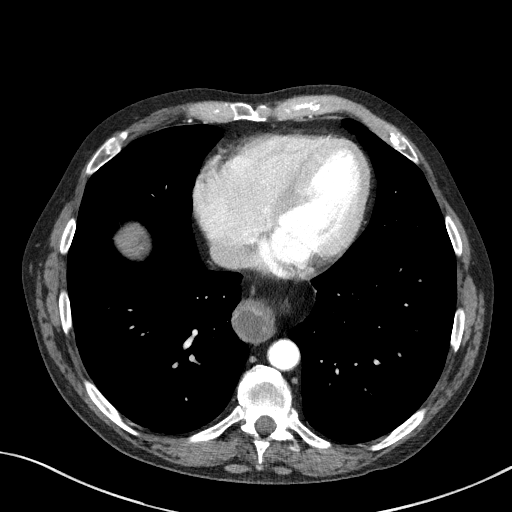

[Series 6: coronal · coronal · 0.74mm/px · 3 of 100 slices shown]
[im 34/100  soft-tissue]
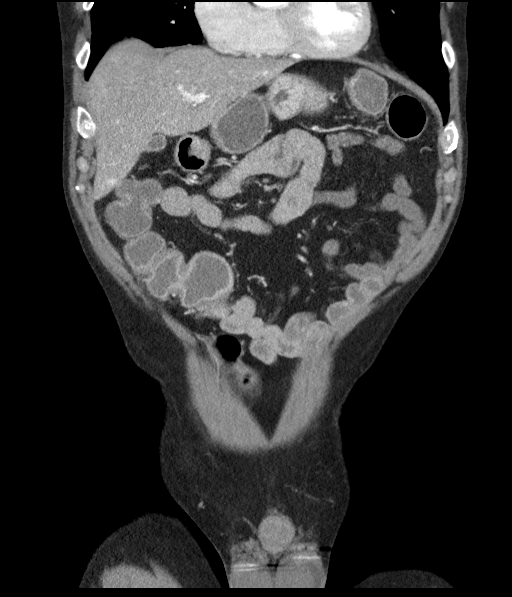
[im 45/100  soft-tissue]
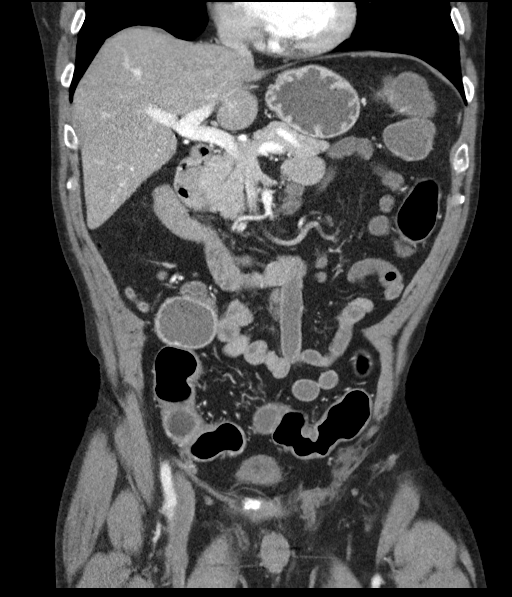
[im 56/100  soft-tissue]
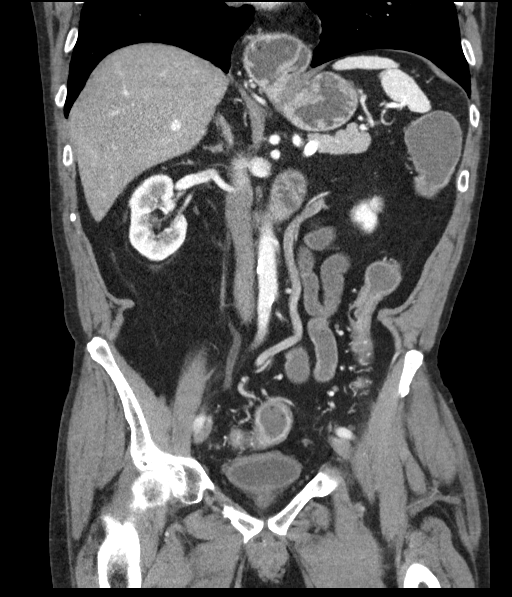

[16 of 46 positions shown; findings below may reference images not displayed]

RADIATION DOSE REDUCTION: This exam was performed according to the
departmental dose-optimization program which includes automated
exposure control, adjustment of the mA and/or kV according to
patient size and/or use of iterative reconstruction technique.

CONTRAST:  100mL OMNIPAQUE IOHEXOL 300 MG/ML  SOLN
FINDINGS: Lower Chest: No acute findings.

Hepatobiliary: No hepatic masses identified. Tiny subcapsular
vascular shunt is noted in segment 2 of the left lobe. Gallbladder
is unremarkable. No evidence of biliary ductal dilatation.

Pancreas:  No mass or inflammatory changes.

Spleen: Within normal limits in size and appearance.

Adrenals/Urinary Tract: No masses identified. Small right renal cyst
noted. No evidence of ureteral calculi or hydronephrosis.

Stomach/Bowel: Small to moderate hiatal hernia is seen. Mild mucosal
thickening and enhancement is seen as well as loss of the normal
haustral fold pattern in the rectosigmoid colon and cecum,
consistent with colitis.

The terminal ileum is normal in appearance. No evidence of small
bowel wall thickening, abnormal contrast enhancement, or mesenteric
inflammatory changes. No evidence of stricture or obstruction. No
signs of penetrating disease, fistulae, or abnormal fluid
collections.

Vascular/Lymphatic: No pathologically enlarged lymph nodes. No acute
vascular findings. Aortic atherosclerotic calcification noted.

Reproductive:  No mass or other significant abnormality.

Other:  None.

Musculoskeletal:  No suspicious bone lesions identified.
IMPRESSION: Mild colitis involving the cecum and rectosigmoid colon. No evidence
of stricture, penetrating disease, or other complication.

Small to moderate hiatal hernia.

Aortic Atherosclerosis ([id number]).

## 2022-11-16 ENCOUNTER — Encounter: Payer: Self-pay | Admitting: Family Medicine

## 2022-11-27 ENCOUNTER — Ambulatory Visit: Payer: Medicare HMO | Admitting: Internal Medicine

## 2022-11-27 ENCOUNTER — Telehealth: Payer: Self-pay | Admitting: Internal Medicine

## 2022-11-27 NOTE — Telephone Encounter (Signed)
Good morning Dr. Carlean Purl,   Patient called stating that he would not be able to make it to his appointment with you this afternoon at 2:10 due to a scheduling conflict.     Patient stated he will call back to reschedule at a later time.

## 2022-11-27 NOTE — Telephone Encounter (Signed)
OK no charge ?

## 2023-03-06 ENCOUNTER — Other Ambulatory Visit: Payer: Self-pay | Admitting: Family Medicine

## 2023-03-08 ENCOUNTER — Encounter: Payer: Self-pay | Admitting: Internal Medicine

## 2023-03-08 ENCOUNTER — Other Ambulatory Visit: Payer: Self-pay | Admitting: Internal Medicine

## 2023-03-08 NOTE — Telephone Encounter (Signed)
Last refill done by Dr Leone Payor on 0510/2023 Last OV 05/2023 seen by Dr Durene Cal for CPE

## 2023-04-22 DIAGNOSIS — H6121 Impacted cerumen, right ear: Secondary | ICD-10-CM | POA: Diagnosis not present

## 2023-04-22 DIAGNOSIS — H9042 Sensorineural hearing loss, unilateral, left ear, with unrestricted hearing on the contralateral side: Secondary | ICD-10-CM | POA: Diagnosis not present

## 2023-04-22 DIAGNOSIS — H9312 Tinnitus, left ear: Secondary | ICD-10-CM | POA: Diagnosis not present

## 2023-05-25 ENCOUNTER — Ambulatory Visit: Payer: Medicare HMO

## 2023-05-25 VITALS — Wt 137.0 lb

## 2023-05-25 DIAGNOSIS — Z0000 Encounter for general adult medical examination without abnormal findings: Secondary | ICD-10-CM

## 2023-05-25 NOTE — Patient Instructions (Signed)
Richard Grant , Thank you for taking time to come for your Medicare Wellness Visit. I appreciate your ongoing commitment to your health goals. Please review the following plan we discussed and let me know if I can assist you in the future.   Referrals/Orders/Follow-Ups/Clinician Recommendations: stay active and healthy   This is a list of the screening recommended for you and due dates:  Health Maintenance  Topic Date Due   Zoster (Shingles) Vaccine (1 of 2) Never done   Colon Cancer Screening  03/26/2022   COVID-19 Vaccine (6 - 2023-24 season) 06/26/2022   Flu Shot  05/27/2023   Medicare Annual Wellness Visit  05/24/2024   DTaP/Tdap/Td vaccine (2 - Td or Tdap) 02/18/2031   Pneumonia Vaccine  Completed   Hepatitis C Screening  Completed   HPV Vaccine  Aged Out    Advanced directives: (Copy Requested) Please bring a copy of your health care power of attorney and living will to the office to be added to your chart at your convenience.  Next Medicare Annual Wellness Visit scheduled for next year: Yes  Preventive Care 70 Years and Older, Male  Preventive care refers to lifestyle choices and visits with your health care provider that can promote health and wellness. What does preventive care include? A yearly physical exam. This is also called an annual well check. Dental exams once or twice a year. Routine eye exams. Ask your health care provider how often you should have your eyes checked. Personal lifestyle choices, including: Daily care of your teeth and gums. Regular physical activity. Eating a healthy diet. Avoiding tobacco and drug use. Limiting alcohol use. Practicing safe sex. Taking low doses of aspirin every day. Taking vitamin and mineral supplements as recommended by your health care provider. What happens during an annual well check? The services and screenings done by your health care provider during your annual well check will depend on your age, overall health,  lifestyle risk factors, and family history of disease. Counseling  Your health care provider may ask you questions about your: Alcohol use. Tobacco use. Drug use. Emotional well-being. Home and relationship well-being. Sexual activity. Eating habits. History of falls. Memory and ability to understand (cognition). Work and work Astronomer. Screening  You may have the following tests or measurements: Height, weight, and BMI. Blood pressure. Lipid and cholesterol levels. These may be checked every 5 years, or more frequently if you are over 41 years old. Skin check. Lung cancer screening. You may have this screening every year starting at age 60 if you have a 30-pack-year history of smoking and currently smoke or have quit within the past 15 years. Fecal occult blood test (FOBT) of the stool. You may have this test every year starting at age 39. Flexible sigmoidoscopy or colonoscopy. You may have a sigmoidoscopy every 5 years or a colonoscopy every 10 years starting at age 65. Prostate cancer screening. Recommendations will vary depending on your family history and other risks. Hepatitis C blood test. Hepatitis B blood test. Sexually transmitted disease (STD) testing. Diabetes screening. This is done by checking your blood sugar (glucose) after you have not eaten for a while (fasting). You may have this done every 1-3 years. Abdominal aortic aneurysm (AAA) screening. You may need this if you are a current or former smoker. Osteoporosis. You may be screened starting at age 41 if you are at high risk. Talk with your health care provider about your test results, treatment options, and if necessary, the need for  more tests. Vaccines  Your health care provider may recommend certain vaccines, such as: Influenza vaccine. This is recommended every year. Tetanus, diphtheria, and acellular pertussis (Tdap, Td) vaccine. You may need a Td booster every 10 years. Zoster vaccine. You may need this  after age 36. Pneumococcal 13-valent conjugate (PCV13) vaccine. One dose is recommended after age 70. Pneumococcal polysaccharide (PPSV23) vaccine. One dose is recommended after age 70. Talk to your health care provider about which screenings and vaccines you need and how often you need them. This information is not intended to replace advice given to you by your health care provider. Make sure you discuss any questions you have with your health care provider. Document Released: 11/08/2015 Document Revised: 07/01/2016 Document Reviewed: 08/13/2015 Elsevier Interactive Patient Education  2017 [employer].  Fall Prevention in the Home Falls can cause injuries. They can happen to people of all ages. There are many things you can do to make your home safe and to help prevent falls. What can I do on the outside of my home? Regularly fix the edges of walkways and driveways and fix any cracks. Remove anything that might make you trip as you walk through a door, such as a raised step or threshold. Trim any bushes or trees on the path to your home. Use bright outdoor lighting. Clear any walking paths of anything that might make someone trip, such as rocks or tools. Regularly check to see if handrails are loose or broken. Make sure that both sides of any steps have handrails. Any raised decks and porches should have guardrails on the edges. Have any leaves, snow, or ice cleared regularly. Use sand or salt on walking paths during winter. Clean up any spills in your garage right away. This includes oil or grease spills. What can I do in the bathroom? Use night lights. Install grab bars by the toilet and in the tub and shower. Do not use towel bars as grab bars. Use non-skid mats or decals in the tub or shower. If you need to sit down in the shower, use a plastic, non-slip stool. Keep the floor dry. Clean up any water that spills on the floor as soon as it happens. Remove soap buildup in the tub or  shower regularly. Attach bath mats securely with double-sided non-slip rug tape. Do not have throw rugs and other things on the floor that can make you trip. What can I do in the bedroom? Use night lights. Make sure that you have a light by your bed that is easy to reach. Do not use any sheets or blankets that are too big for your bed. They should not hang down onto the floor. Have a firm chair that has side arms. You can use this for support while you get dressed. Do not have throw rugs and other things on the floor that can make you trip. What can I do in the kitchen? Clean up any spills right away. Avoid walking on wet floors. Keep items that you use a lot in easy-to-reach places. If you need to reach something above you, use a strong step stool that has a grab bar. Keep electrical cords out of the way. Do not use floor polish or wax that makes floors slippery. If you must use wax, use non-skid floor wax. Do not have throw rugs and other things on the floor that can make you trip. What can I do with my stairs? Do not leave any items on the stairs. Make  sure that there are handrails on both sides of the stairs and use them. Fix handrails that are broken or loose. Make sure that handrails are as long as the stairways. Check any carpeting to make sure that it is firmly attached to the stairs. Fix any carpet that is loose or worn. Avoid having throw rugs at the top or bottom of the stairs. If you do have throw rugs, attach them to the floor with carpet tape. Make sure that you have a light switch at the top of the stairs and the bottom of the stairs. If you do not have them, ask someone to add them for you. What else can I do to help prevent falls? Wear shoes that: Do not have high heels. Have rubber bottoms. Are comfortable and fit you well. Are closed at the toe. Do not wear sandals. If you use a stepladder: Make sure that it is fully opened. Do not climb a closed stepladder. Make  sure that both sides of the stepladder are locked into place. Ask someone to hold it for you, if possible. Clearly mark and make sure that you can see: Any grab bars or handrails. First and last steps. Where the edge of each step is. Use tools that help you move around (mobility aids) if they are needed. These include: Canes. Walkers. Scooters. Crutches. Turn on the lights when you go into a dark area. Replace any light bulbs as soon as they burn out. Set up your furniture so you have a clear path. Avoid moving your furniture around. If any of your floors are uneven, fix them. If there are any pets around you, be aware of where they are. Review your medicines with your doctor. Some medicines can make you feel dizzy. This can increase your chance of falling. Ask your doctor what other things that you can do to help prevent falls. This information is not intended to replace advice given to you by your health care provider. Make sure you discuss any questions you have with your health care provider. Document Released: 08/08/2009 Document Revised: 03/19/2016 Document Reviewed: 11/16/2014 Elsevier Interactive Patient Education  2017 [employer].

## 2023-05-25 NOTE — Progress Notes (Signed)
Subjective:   Richard Grant is a 72 y.o. male who presents for Medicare Annual/Subsequent preventive examination.  Visit Complete: Virtual  I connected with  Richard Grant on 05/25/23 by a audio enabled telemedicine application and verified that I am speaking with the correct person using two identifiers.  Patient Location: Home  Provider Location: Office/Clinic  I discussed the limitations of evaluation and management by telemedicine. The patient expressed understanding and agreed to proceed.  Patient Medicare AWV questionnaire was completed by the patient on 05/25/23; I have confirmed that all information answered by patient is correct and no changes since this date.  Review of Systems     Cardiac Risk Factors include: advanced age (>86men, >4 women);male gender;hypertension     Objective:    Today's Vitals   05/25/23 1133  Weight: 137 lb (62.1 kg)   Body mass index is 25.06 kg/m.     05/25/2023   11:37 AM 06/05/2022    8:41 AM 05/30/2021    9:04 AM  Advanced Directives  Does Patient Have a Medical Advance Directive? Yes Yes Yes  Type of Estate agent of [place];Living will Healthcare Power of Attorney Living will  Copy of Healthcare Power of Attorney in Chart? No - copy requested No - copy requested     Current Medications (verified) Outpatient Encounter Medications as of 05/25/2023  Medication Sig   AMBULATORY NON FORMULARY MEDICATION Medication Name: Budesonide 5mg  capsules Sig: Take 2 capsules daily   hydrochlorothiazide (HYDRODIURIL) 25 MG tablet Take 25 mg by mouth daily.   metoprolol succinate (TOPROL-XL) 50 MG 24 hr tablet Take 1 tablet (50 mg total) by mouth daily. Take with or immediately following a meal.   Milk Thistle 500 MG CAPS Take 1 capsule by mouth daily.   MULTIPLE VITAMIN PO Take 1 capsule by mouth daily.   OVER THE COUNTER MEDICATION Iron 65mg  one daily   pantoprazole (PROTONIX) 20 MG tablet TAKE 1 TABLET BY MOUTH EVERY DAY  BEFORE BREAKFAST   Saw Palmetto, Serenoa repens, (SAW PALMETTO PO) Take 1 capsule by mouth daily.   SHINGRIX injection    SPIKEVAX injection    No facility-administered encounter medications on file as of 05/25/2023.    Allergies (verified) Lactose intolerance (gi) and Latex   History: Past Medical History:  Diagnosis Date   Arthritis    B12 deficiency 11/28/2021   Barrett's esophagus    GERD (gastroesophageal reflux disease)    Hypertension    Ileitis    backwash   Iron deficiency 11/28/2021   Ulcerative pancolitis [facility])    Past Surgical History:  Procedure Laterality Date   COLONOSCOPY  03/26/2021   ESOPHAGOGASTRODUODENOSCOPY  03/26/2021   HERNIA REPAIR  2012   Family History  Problem Relation Age of Onset   Arthritis Mother    Hypertension Mother    Diabetes Father    Early death Father    Hypertension Father    Drug abuse Sister    Mental illness Sister    Leukemia Maternal Grandfather    Liver cancer Paternal Grandfather    Colon cancer Neg Hx    Esophageal cancer Neg Hx    Stomach cancer Neg Hx    Pancreatic cancer Neg Hx    Social History   Socioeconomic History   Marital status: Media planner    Spouse name: Not on file   Number of children: Not on file   Years of education: Not on file   Highest education level: Not on file  Occupational History   Not on file  Tobacco Use   Smoking status: Never   Smokeless tobacco: Never  Vaping Use   Vaping status: Never Used  Substance and Sexual Activity   Alcohol use: Yes    Alcohol/week: 3.0 standard drinks of alcohol    Types: 3 Glasses of wine per week    Comment: DAILY    Drug use: Never   Sexual activity: Not on file  Other Topics Concern   Not on file  Social History Narrative   Retired - Psychologist, sport and exercise x 50 yrs   [place]: Ornithology   Lives with life partner   No kids   3 glasses wine/day   Never smoker, no drugs   Social Determinants of Health    Financial Resource Strain: Low Risk  (05/25/2023)   Overall Financial Resource Strain (CARDIA)    Difficulty of Paying Living Expenses: Not hard at all  Food Insecurity: No Food Insecurity (05/25/2023)   Hunger Vital Sign    Worried About Running Out of Food in the Last Year: Never true    Ran Out of Food in the Last Year: Never true  Transportation Needs: No Transportation Needs (05/25/2023)   PRAPARE - Administrator, Civil Service (Medical): No    Lack of Transportation (Non-Medical): No  Physical Activity: Insufficiently Active (05/25/2023)   Exercise Vital Sign    Days of Exercise per Week: 3 days    Minutes of Exercise per Session: 30 min  Stress: Stress Concern Present (05/25/2023)   [employer] of Occupational Health - Occupational Stress Questionnaire    Feeling of Stress : To some extent  Social Connections: Moderately Isolated (05/25/2023)   Social Connection and Isolation Panel [NHANES]    Frequency of Communication with Friends and Family: Three times a week    Frequency of Social Gatherings with Friends and Family: Once a week    Attends Religious Services: Never    Database administrator or Organizations: No    Attends Engineer, structural: Never    Marital Status: Living with partner    Tobacco Counseling Counseling given: Not Answered   Clinical Intake:  Pre-visit preparation completed: Yes  Pain : No/denies pain     Nutritional Status: BMI of 19-24  Normal Nutritional Risks: None Diabetes: No  How often do you need to have someone help you when you read instructions, pamphlets, or other written materials from your doctor or pharmacy?: 1 - Never  Interpreter Needed?: No  Information entered by :: Lanier Ensign, LPN   Activities of Daily Living    05/25/2023    9:07 AM 06/05/2022    8:42 AM  In your present state of health, do you have any difficulty performing the following activities:  Hearing? 0 0  Vision? 0 0   Difficulty concentrating or making decisions? 0 0  Walking or climbing stairs? 0 0  Dressing or bathing? 0 0  Doing errands, shopping? 0 0  Preparing Food and eating ? N N  Using the Toilet? N N  In the past six months, have you accidently leaked urine? N N  Do you have problems with loss of bowel control? N N  Managing your Medications? N N  Managing your Finances? N N  Housekeeping or managing your Housekeeping? N N    Patient Care Team: Ardith Dark, MD as PCP - General (Family Medicine)  Indicate any recent Medical  Services you may have received from other than [facility] providers in the past year (date may be approximate).     Assessment:   This is a routine wellness examination for Richard Grant.  Hearing/Vision screen Hearing Screening - Comments:: Pt denies any hearing issues  Vision Screening - Comments:: Pt  encouraged to followupnwith Eye provider  Dietary issues and exercise activities discussed:     Goals Addressed             This Visit's Progress    Patient Stated       Keep active and healthy        Depression Screen    05/25/2023   11:37 AM 06/05/2022    8:40 AM 08/21/2021    8:04 AM 05/30/2021    9:02 AM 12/05/2020   11:37 AM  PHQ 2/9 Scores  PHQ - 2 Score 1 0 0 0 0    Fall Risk    05/25/2023    9:07 AM 06/05/2022    8:42 AM 08/21/2021    8:04 AM 05/30/2021    9:06 AM  Fall Risk   Falls in the past year? 0 0 0 0  Number falls in past yr: 0 0 0 0  Injury with Fall? 0 0 0 0  Risk for fall due to : Impaired vision Impaired vision History of fall(s) Impaired vision  Follow up Falls prevention discussed Falls prevention discussed Education provided Falls prevention discussed    MEDICARE RISK AT HOME:   TIMED UP AND GO:  Was the test performed?  No    Cognitive Function:        05/25/2023   11:40 AM 06/05/2022    8:42 AM 05/30/2021    9:08 AM  6CIT Screen  What Year? 0 points 0 points 0 points  What month? 0 points 0 points 0 points  What  time? 0 points 0 points 0 points  Count back from 20 0 points 0 points 0 points  Months in reverse 0 points 0 points 0 points  Repeat phrase 0 points 0 points 0 points  Total Score 0 points 0 points 0 points    Immunizations Immunization History  Administered Date(s) Administered   Covid-19, Mrna,Vaccine(Spikevax)51yrs and older 01/07/2023   Influenza, High Dose Seasonal PF 07/24/2021   Influenza-Unspecified 07/26/2020   Moderna Covid-19 Vaccine Bivalent Booster 53yrs & up 08/05/2021   Moderna Sars-Covid-2 Vaccination 01/10/2020, 02/08/2020, 08/28/2020, 02/12/2021   PNEUMOCOCCAL CONJUGATE-20 04/05/2021   Tdap 02/17/2021   Zoster Recombinant(Shingrix) 10/11/2022, 12/14/2022    TDAP status: Up to date  Flu Vaccine status: Up to date  Pneumococcal vaccine status: Up to date  Covid-19 vaccine status: Information provided on how to obtain vaccines.   Qualifies for Shingles Vaccine? Yes   Zostavax completed Yes   Shingrix Completed?: Yes  Screening Tests Health Maintenance  Topic Date Due   Colonoscopy  03/26/2022   COVID-19 Vaccine (7 - 2023-24 season) 03/04/2023   INFLUENZA VACCINE  05/27/2023   Medicare Annual Wellness (AWV)  05/24/2024   DTaP/Tdap/Td (2 - Td or Tdap) 02/18/2031   Pneumonia Vaccine 49+ Years old  Completed   Hepatitis C Screening  Completed   Zoster Vaccines- Shingrix  Completed   HPV VACCINES  Aged Out    Health Maintenance  Health Maintenance Due  Topic Date Due   Colonoscopy  03/26/2022   COVID-19 Vaccine (7 - 2023-24 season) 03/04/2023    Postponed colonoscopy at this time    Additional Screening:  Hepatitis C  Screening:  Completed 08/21/21    Vision Screening: Recommended annual ophthalmology exams for early detection of glaucoma and other disorders of the eye. Is the patient up to date with their annual eye exam?  No  Who is the provider or what is the name of the office in which the patient attends annual eye exams? Encouraged to  follow up  If pt is not established with a provider, would they like to be referred to a provider to establish care? No .   Dental Screening: Recommended annual dental exams for proper oral hygiene    Community Resource Referral / Chronic Care Management: CRR required this visit?  No   CCM required this visit?  No     Plan:     I have personally reviewed and noted the following in the patient's chart:   Medical and social history Use of alcohol, tobacco or illicit drugs  Current medications and supplements including opioid prescriptions. Patient is not currently taking opioid prescriptions. Functional ability and status Nutritional status Physical activity Advanced directives List of other physicians Hospitalizations, surgeries, and ER visits in previous 12 months Vitals Screenings to include cognitive, depression, and falls Referrals and appointments  In addition, I have reviewed and discussed with patient certain preventive protocols, quality metrics, and best practice recommendations. A written personalized care plan for preventive services as well as general preventive health recommendations were provided to patient.     Marzella Schlein, LPN   [id number]   After Visit Summary: (MyChart) Due to this being a telephonic visit, the after visit summary with patients personalized plan was offered to patient via MyChart   Nurse Notes: none

## 2023-06-01 NOTE — Progress Notes (Signed)
Subjective:   Richard Grant is a 72 y.o. male who presents for Medicare Annual/Subsequent preventive examination.  Visit Complete: Virtual  I connected with  Richard Grant on 06/01/23 by a audio enabled telemedicine application and verified that I am speaking with the correct person using two identifiers.  Patient Location: Home  Provider Location: Office/Clinic  I discussed the limitations of evaluation and management by telemedicine. The patient expressed understanding and agreed to proceed.  Patient Medicare AWV questionnaire was completed by the patient on 05/25/23; I have confirmed that all information answered by patient is correct and no changes since this date.   Vital Signs: Unable to obtain new vitals due to this being a telehealth visit.   Review of Systems     Cardiac Risk Factors include: advanced age (>56men, >2 women);male gender;hypertension     Objective:    Today's Vitals   05/25/23 1133  Weight: 137 lb (62.1 kg)   Body mass index is 25.06 kg/m.     05/25/2023   11:37 AM 06/05/2022    8:41 AM 05/30/2021    9:04 AM  Advanced Directives  Does Patient Have a Medical Advance Directive? Yes Yes Yes  Type of Estate agent of [place];Living will Healthcare Power of Attorney Living will  Copy of Healthcare Power of Attorney in Chart? No - copy requested No - copy requested     Current Medications (verified) Outpatient Encounter Medications as of 05/25/2023  Medication Sig   AMBULATORY NON FORMULARY MEDICATION Medication Name: Budesonide 5mg  capsules Sig: Take 2 capsules daily   hydrochlorothiazide (HYDRODIURIL) 25 MG tablet Take 25 mg by mouth daily.   metoprolol succinate (TOPROL-XL) 50 MG 24 hr tablet Take 1 tablet (50 mg total) by mouth daily. Take with or immediately following a meal.   Milk Thistle 500 MG CAPS Take 1 capsule by mouth daily.   MULTIPLE VITAMIN PO Take 1 capsule by mouth daily.   OVER THE COUNTER MEDICATION Iron 65mg   one daily   pantoprazole (PROTONIX) 20 MG tablet TAKE 1 TABLET BY MOUTH EVERY DAY BEFORE BREAKFAST   Saw Palmetto, Serenoa repens, (SAW PALMETTO PO) Take 1 capsule by mouth daily.   SHINGRIX injection    SPIKEVAX injection    No facility-administered encounter medications on file as of 05/25/2023.    Allergies (verified) Lactose intolerance (gi) and Latex   History: Past Medical History:  Diagnosis Date   Arthritis    B12 deficiency 11/28/2021   Barrett's esophagus    GERD (gastroesophageal reflux disease)    Hypertension    Ileitis    backwash   Iron deficiency 11/28/2021   Ulcerative pancolitis [facility])    Past Surgical History:  Procedure Laterality Date   COLONOSCOPY  03/26/2021   ESOPHAGOGASTRODUODENOSCOPY  03/26/2021   HERNIA REPAIR  2012   Family History  Problem Relation Age of Onset   Arthritis Mother    Hypertension Mother    Diabetes Father    Early death Father    Hypertension Father    Drug abuse Sister    Mental illness Sister    Leukemia Maternal Grandfather    Liver cancer Paternal Grandfather    Colon cancer Neg Hx    Esophageal cancer Neg Hx    Stomach cancer Neg Hx    Pancreatic cancer Neg Hx    Social History   Socioeconomic History   Marital status: Media planner    Spouse name: Not on file   Number of children: Not on  file   Years of education: Not on file   Highest education level: Not on file  Occupational History   Not on file  Tobacco Use   Smoking status: Never   Smokeless tobacco: Never  Vaping Use   Vaping status: Never Used  Substance and Sexual Activity   Alcohol use: Yes    Alcohol/week: 3.0 standard drinks of alcohol    Types: 3 Glasses of wine per week    Comment: DAILY    Drug use: Never   Sexual activity: Not on file  Other Topics Concern   Not on file  Social History Narrative   Retired - Psychologist, sport and exercise x 50 yrs   [place]: Ornithology   Lives with life partner   No kids    3 glasses wine/day   Never smoker, no drugs   Social Determinants of Health   Financial Resource Strain: Low Risk  (05/25/2023)   Overall Financial Resource Strain (CARDIA)    Difficulty of Paying Living Expenses: Not hard at all  Food Insecurity: No Food Insecurity (05/25/2023)   Hunger Vital Sign    Worried About Running Out of Food in the Last Year: Never true    Ran Out of Food in the Last Year: Never true  Transportation Needs: No Transportation Needs (05/25/2023)   PRAPARE - Administrator, Civil Service (Medical): No    Lack of Transportation (Non-Medical): No  Physical Activity: Insufficiently Active (05/25/2023)   Exercise Vital Sign    Days of Exercise per Week: 3 days    Minutes of Exercise per Session: 30 min  Stress: Stress Concern Present (05/25/2023)   [employer] of Occupational Health - Occupational Stress Questionnaire    Feeling of Stress : To some extent  Social Connections: Moderately Isolated (05/25/2023)   Social Connection and Isolation Panel [NHANES]    Frequency of Communication with Friends and Family: Three times a week    Frequency of Social Gatherings with Friends and Family: Once a week    Attends Religious Services: Never    Database administrator or Organizations: No    Attends Engineer, structural: Never    Marital Status: Living with partner    Tobacco Counseling Counseling given: Not Answered   Clinical Intake:  Pre-visit preparation completed: Yes  Pain : No/denies pain     Nutritional Status: BMI of 19-24  Normal Nutritional Risks: None Diabetes: No  How often do you need to have someone help you when you read instructions, pamphlets, or other written materials from your doctor or pharmacy?: 1 - Never  Interpreter Needed?: No  Information entered by :: Lanier Ensign, LPN   Activities of Daily Living    05/25/2023    9:07 AM 06/05/2022    8:42 AM  In your present state of health, do you have any  difficulty performing the following activities:  Hearing? 0 0  Vision? 0 0  Difficulty concentrating or making decisions? 0 0  Walking or climbing stairs? 0 0  Dressing or bathing? 0 0  Doing errands, shopping? 0 0  Preparing Food and eating ? N N  Using the Toilet? N N  In the past six months, have you accidently leaked urine? N N  Do you have problems with loss of bowel control? N N  Managing your Medications? N N  Managing your Finances? N N  Housekeeping or managing your Housekeeping? N N  Patient Care Team: Ardith Dark, MD as PCP - General (Family Medicine)  Indicate any recent Medical Services you may have received from other than [facility] providers in the past year (date may be approximate).     Assessment:   This is a routine wellness examination for Richard Grant.  Hearing/Vision screen Hearing Screening - Comments:: Pt denies any hearing issues  Vision Screening - Comments:: Pt  encouraged to followupnwith Eye provider  Dietary issues and exercise activities discussed:     Goals Addressed             This Visit's Progress    Patient Stated       Keep active and healthy        Depression Screen    05/25/2023   11:37 AM 06/05/2022    8:40 AM 08/21/2021    8:04 AM 05/30/2021    9:02 AM 12/05/2020   11:37 AM  PHQ 2/9 Scores  PHQ - 2 Score 1 0 0 0 0    Fall Risk    05/25/2023    9:07 AM 06/05/2022    8:42 AM 08/21/2021    8:04 AM 05/30/2021    9:06 AM  Fall Risk   Falls in the past year? 0 0 0 0  Number falls in past yr: 0 0 0 0  Injury with Fall? 0 0 0 0  Risk for fall due to : Impaired vision Impaired vision History of fall(s) Impaired vision  Follow up Falls prevention discussed Falls prevention discussed Education provided Falls prevention discussed    MEDICARE RISK AT HOME:   TIMED UP AND GO:  Was the test performed?  No    Cognitive Function:        05/25/2023   11:40 AM 06/05/2022    8:42 AM 05/30/2021    9:08 AM  6CIT Screen  What  Year? 0 points 0 points 0 points  What month? 0 points 0 points 0 points  What time? 0 points 0 points 0 points  Count back from 20 0 points 0 points 0 points  Months in reverse 0 points 0 points 0 points  Repeat phrase 0 points 0 points 0 points  Total Score 0 points 0 points 0 points    Immunizations Immunization History  Administered Date(s) Administered   Covid-19, Mrna,Vaccine(Spikevax)41yrs and older 01/07/2023   Influenza, High Dose Seasonal PF 07/24/2021   Influenza-Unspecified 07/26/2020   Moderna Covid-19 Vaccine Bivalent Booster 69yrs & up 08/05/2021   Moderna Sars-Covid-2 Vaccination 01/10/2020, 02/08/2020, 08/28/2020, 02/12/2021   PNEUMOCOCCAL CONJUGATE-20 04/05/2021   Tdap 02/17/2021   Zoster Recombinant(Shingrix) 10/11/2022, 12/14/2022    TDAP status: Up to date  Flu Vaccine status: Up to date  Pneumococcal vaccine status: Up to date  Covid-19 vaccine status: Information provided on how to obtain vaccines.   Qualifies for Shingles Vaccine? Yes   Zostavax completed Yes   Shingrix Completed?: Yes  Screening Tests Health Maintenance  Topic Date Due   Colonoscopy  03/26/2022   COVID-19 Vaccine (7 - 2023-24 season) 03/04/2023   INFLUENZA VACCINE  05/27/2023   Medicare Annual Wellness (AWV)  05/24/2024   DTaP/Tdap/Td (2 - Td or Tdap) 02/18/2031   Pneumonia Vaccine 64+ Years old  Completed   Hepatitis C Screening  Completed   Zoster Vaccines- Shingrix  Completed   HPV VACCINES  Aged Out    Health Maintenance  Health Maintenance Due  Topic Date Due   Colonoscopy  03/26/2022   COVID-19 Vaccine (7 - 2023-24  season) 03/04/2023   INFLUENZA VACCINE  05/27/2023    Postponed colonoscopy at this time    Additional Screening:  Hepatitis C Screening:  Completed 08/21/21    Vision Screening: Recommended annual ophthalmology exams for early detection of glaucoma and other disorders of the eye. Is the patient up to date with their annual eye exam?  No   Who is the provider or what is the name of the office in which the patient attends annual eye exams? Encouraged to follow up  If pt is not established with a provider, would they like to be referred to a provider to establish care? No .   Dental Screening: Recommended annual dental exams for proper oral hygiene    Community Resource Referral / Chronic Care Management: CRR required this visit?  No   CCM required this visit?  No     Plan:     I have personally reviewed and noted the following in the patient's chart:   Medical and social history Use of alcohol, tobacco or illicit drugs  Current medications and supplements including opioid prescriptions. Patient is not currently taking opioid prescriptions. Functional ability and status Nutritional status Physical activity Advanced directives List of other physicians Hospitalizations, surgeries, and ER visits in previous 12 months Vitals Screenings to include cognitive, depression, and falls Referrals and appointments  In addition, I have reviewed and discussed with patient certain preventive protocols, quality metrics, and best practice recommendations. A written personalized care plan for preventive services as well as general preventive health recommendations were provided to patient.     Marzella Schlein, LPN   10/31/1094   After Visit Summary: (MyChart) Due to this being a telephonic visit, the after visit summary with patients personalized plan was offered to patient via MyChart   Nurse Notes: none

## 2023-06-02 ENCOUNTER — Other Ambulatory Visit: Payer: Self-pay | Admitting: Internal Medicine

## 2023-06-22 ENCOUNTER — Encounter: Payer: Self-pay | Admitting: Family Medicine

## 2023-06-23 ENCOUNTER — Other Ambulatory Visit: Payer: Self-pay | Admitting: *Deleted

## 2023-06-23 ENCOUNTER — Other Ambulatory Visit: Payer: Self-pay | Admitting: Family Medicine

## 2023-06-23 MED ORDER — METOPROLOL SUCCINATE ER 50 MG PO TB24: 50.0000 mg | ORAL_TABLET | Freq: Every day | ORAL | 0 refills | Status: DC

## 2023-06-25 NOTE — Telephone Encounter (Signed)
Noted  

## 2023-07-15 DIAGNOSIS — Z23 Encounter for immunization: Secondary | ICD-10-CM | POA: Diagnosis not present

## 2023-07-15 DIAGNOSIS — Z2989 Encounter for other specified prophylactic measures: Secondary | ICD-10-CM | POA: Diagnosis not present

## 2023-08-30 ENCOUNTER — Other Ambulatory Visit: Payer: Self-pay | Admitting: *Deleted

## 2023-08-30 MED ORDER — METOPROLOL SUCCINATE ER 50 MG PO TB24: 50.0000 mg | ORAL_TABLET | Freq: Every day | ORAL | 0 refills | Status: DC

## 2023-09-02 ENCOUNTER — Other Ambulatory Visit: Payer: Self-pay | Admitting: Internal Medicine

## 2023-09-29 ENCOUNTER — Other Ambulatory Visit: Payer: Self-pay | Admitting: Family Medicine

## 2023-10-11 ENCOUNTER — Encounter: Payer: Self-pay | Admitting: Family Medicine

## 2023-10-11 ENCOUNTER — Ambulatory Visit (INDEPENDENT_AMBULATORY_CARE_PROVIDER_SITE_OTHER): Payer: Medicare HMO | Admitting: Family Medicine

## 2023-10-11 VITALS — BP 166/88 | HR 89 | Temp 97.5°F | Resp 18 | Ht 62.0 in | Wt 146.2 lb

## 2023-10-11 DIAGNOSIS — Z125 Encounter for screening for malignant neoplasm of prostate: Secondary | ICD-10-CM

## 2023-10-11 DIAGNOSIS — E538 Deficiency of other specified B group vitamins: Secondary | ICD-10-CM

## 2023-10-11 DIAGNOSIS — E611 Iron deficiency: Secondary | ICD-10-CM

## 2023-10-11 DIAGNOSIS — K51018 Ulcerative (chronic) pancolitis with other complication: Secondary | ICD-10-CM

## 2023-10-11 DIAGNOSIS — I10 Essential (primary) hypertension: Secondary | ICD-10-CM | POA: Diagnosis not present

## 2023-10-11 DIAGNOSIS — Z131 Encounter for screening for diabetes mellitus: Secondary | ICD-10-CM

## 2023-10-11 DIAGNOSIS — Z13220 Encounter for screening for lipoid disorders: Secondary | ICD-10-CM

## 2023-10-11 DIAGNOSIS — M2550 Pain in unspecified joint: Secondary | ICD-10-CM | POA: Insufficient documentation

## 2023-10-11 LAB — COMPREHENSIVE METABOLIC PANEL
ALT: 22 U/L (ref 0–53)
AST: 23 U/L (ref 0–37)
Albumin: 4.5 g/dL (ref 3.5–5.2)
Alkaline Phosphatase: 67 U/L (ref 39–117)
BUN: 19 mg/dL (ref 6–23)
CO2: 27 meq/L (ref 19–32)
Calcium: 9.7 mg/dL (ref 8.4–10.5)
Chloride: 101 meq/L (ref 96–112)
Creatinine, Ser: 1.08 mg/dL (ref 0.40–1.50)
GFR: 69.72 mL/min (ref >60.00)
Glucose, Bld: 99 mg/dL (ref 70–99)
Potassium: 3.7 meq/L (ref 3.5–5.1)
Sodium: 142 meq/L (ref 135–145)
Total Bilirubin: 0.6 mg/dL (ref 0.2–1.2)
Total Protein: 7.4 g/dL (ref 6.0–8.3)

## 2023-10-11 LAB — LIPID PANEL
Cholesterol: 243 mg/dL — ABNORMAL HIGH (ref 0–200)
HDL: 94.8 mg/dL (ref >39.00)
LDL Cholesterol: 103 mg/dL — ABNORMAL HIGH (ref 0–99)
NonHDL: 148.45
Total CHOL/HDL Ratio: 3
Triglycerides: 229 mg/dL — ABNORMAL HIGH (ref 0.0–149.0)
VLDL: 45.8 mg/dL — ABNORMAL HIGH (ref 0.0–40.0)

## 2023-10-11 LAB — CBC WITH DIFFERENTIAL/PLATELET
Basophils Absolute: 0 10*3/uL (ref 0.0–0.1)
Basophils Relative: 0.3 % (ref 0.0–3.0)
Eosinophils Absolute: 0.1 10*3/uL (ref 0.0–0.7)
Eosinophils Relative: 0.5 % (ref 0.0–5.0)
HCT: 41 % (ref 39.0–52.0)
Hemoglobin: 14.2 g/dL (ref 13.0–17.0)
Lymphocytes Relative: 11.7 % — ABNORMAL LOW (ref 12.0–46.0)
Lymphs Abs: 1.3 10*3/uL (ref 0.7–4.0)
MCHC: 34.6 g/dL (ref 30.0–36.0)
MCV: 94.7 fL (ref 78.0–100.0)
Monocytes Absolute: 0.9 10*3/uL (ref 0.1–1.0)
Monocytes Relative: 7.7 % (ref 3.0–12.0)
Neutro Abs: 8.8 10*3/uL — ABNORMAL HIGH (ref 1.4–7.7)
Neutrophils Relative %: 79.8 % — ABNORMAL HIGH (ref 43.0–77.0)
Platelets: 318 10*3/uL (ref 150.0–400.0)
RBC: 4.33 Mil/uL (ref 4.22–5.81)
RDW: 13.1 % (ref 11.5–15.5)
WBC: 11 10*3/uL — ABNORMAL HIGH (ref 4.0–10.5)

## 2023-10-11 LAB — IBC + FERRITIN
Ferritin: 142 ng/mL (ref 22.0–322.0)
Iron: 111 ug/dL (ref 42–165)
Saturation Ratios: 31.8 % (ref 20.0–50.0)
TIBC: 348.6 ug/dL (ref 250.0–450.0)
Transferrin: 249 mg/dL (ref 212.0–360.0)

## 2023-10-11 LAB — HEMOGLOBIN A1C: Hgb A1c MFr Bld: 5.4 % (ref 4.6–6.5)

## 2023-10-11 LAB — VITAMIN B12: Vitamin B-12: 124 pg/mL — ABNORMAL LOW (ref 211–911)

## 2023-10-11 LAB — PSA: PSA: 2.14 ng/mL (ref 0.10–4.00)

## 2023-10-11 LAB — TSH: TSH: 3.2 u[IU]/mL (ref 0.35–5.50)

## 2023-10-11 MED ORDER — METOPROLOL SUCCINATE ER 50 MG PO TB24: 50.0000 mg | ORAL_TABLET | Freq: Every day | ORAL | 3 refills | Status: DC

## 2023-10-11 MED ORDER — BLOOD PRESSURE MONITORING KIT: PACK | 0 refills | Status: DC

## 2023-10-11 NOTE — Assessment & Plan Note (Addendum)
Likely has underlying osteoarthritis.  We did recommend referral to orthopedics or sports medicine however he declined for now as symptoms are fairly manageable.  He can continue over-the-counter medications as needed.  He will let us know if he changes his mind about referral

## 2023-10-11 NOTE — Patient Instructions (Addendum)
It was very nice to see you today!  We will check labs.  I will refill your meds.  Please continue to monitor your blood pressure and let us know if persistently elevated at home.  Please let us know if you would like a referral to see sports medicine.   Return in about 1 year (around 10/10/2024) for Annual Physical.   Take care, Dr Jimmey Ralph  PLEASE NOTE:  If you had any lab tests, please let us know if you have not heard back within a few days. You may see your results on mychart before we have a chance to review them but we will give you a call once they are reviewed by [place].   If we ordered any referrals today, please let us know if you have not heard from their office within the next week.   If you had any urgent prescriptions sent in today, please check with the pharmacy within an hour of our visit to make sure the prescription was transmitted appropriately.   Please try these tips to maintain a healthy lifestyle:  Eat at least 3 REAL meals and 1-2 snacks per day.  Aim for no more than 5 hours between eating.  If you eat breakfast, please do so within one hour of getting up.   Each meal should contain half fruits/vegetables, one quarter protein, and one quarter carbs (no bigger than a computer mouse)  Cut down on sweet beverages. This includes juice, soda, and sweet tea.   Drink at least 1 glass of water with each meal and aim for at least 8 glasses per day  Exercise at least 150 minutes every week.

## 2023-10-11 NOTE — Assessment & Plan Note (Signed)
Overall symptoms are stable.  He is followed with GI for this.  No recent flares.  No longer on budesonide.  Will defer further management to GI.

## 2023-10-11 NOTE — Assessment & Plan Note (Signed)
Above goal.  He was at goal per JNC 8 at his last office visit.  He likely does have some element of whitecoat hypertension.  Will check labs today.  Will continue metoprolol tartrate 50 mg daily and HCTZ 25 mg daily.  He will monitor at home for the next few weeks and check in with [place] via MyChart.  If still elevated at home we will increase dose of metoprolol.  Will send prescription in for blood pressure cuff today to see if his insurance will pay for this.

## 2023-10-11 NOTE — Progress Notes (Signed)
   Richard Grant is a 72 y.o. male who presents today for an office visit.  Assessment/Plan:  Chronic Problems Addressed Today: Essential hypertension Above goal.  He was at goal per JNC 8 at his last office visit.  He likely does have some element of whitecoat hypertension.  Will check labs today.  Will continue metoprolol tartrate 50 mg daily and HCTZ 25 mg daily.  He will monitor at home for the next few weeks and check in with [place] via MyChart.  If still elevated at home we will increase dose of metoprolol.  Will send prescription in for blood pressure cuff today to see if his insurance will pay for this.  Polyarthralgia Likely has underlying osteoarthritis.  We did recommend referral to orthopedics or sports medicine however he declined for now as symptoms are fairly manageable.  He can continue over-the-counter medications as needed.  He will let us know if he changes his mind about referral  Ulcerative colitis (HCC) Overall symptoms are stable.  He is followed with GI for this.  No recent flares.  No longer on budesonide.  Will defer further management to GI.  Iron deficiency Check CBC and iron panel.  B12 deficiency Check B12.   Preventative health care Follows with GI for screening colonoscopies.  Up-to-date on vaccines.  Will check labs today.    Subjective:  HPI:  See A/P for status of chronic conditions.  Patient is here today for follow-up.  I last saw him over 2 years ago.  Since our last visit he has been following with gastroenterology for his ulcerative colitis. His symptoms are very well controlled. He was previously on budesonide but he has been able to wean off this.  He has no acute concerns today.  Over the last several months he has noticed more joint aches and pains.  This is usually managed with over-the-counter creams and medications.  He has seen orthopedics in [place] a few years ago when they were planning on shoulder replacement however he does not wish to  pursue this any further.  He has been compliant with his medications.  Needs refill on his metoprolol today.  He has not been checking blood pressure at home.        Objective:  Physical Exam: BP (!) 166/88   Pulse 89   Temp (!) 97.5 F (36.4 C) (Temporal)   Resp 18   Ht 5\' 2"  (1.575 m)   Wt 146 lb 4 oz (66.3 kg)   SpO2 98%   BMI 26.75 kg/m   Gen: No acute distress, resting comfortably CV: Regular rate and rhythm with no murmurs appreciated Pulm: Normal work of breathing, clear to auscultation bilaterally with no crackles, wheezes, or rhonchi Neuro: Grossly normal, moves all extremities Psych: Normal affect and thought content      Mazie Fencl M. Jimmey Ralph, MD 10/11/2023 2:04 PM

## 2023-10-11 NOTE — Assessment & Plan Note (Signed)
Check CBC and iron panel.

## 2023-10-11 NOTE — Assessment & Plan Note (Addendum)
Check B12 

## 2023-10-14 ENCOUNTER — Encounter: Payer: Self-pay | Admitting: Family Medicine

## 2023-10-14 DIAGNOSIS — E785 Hyperlipidemia, unspecified: Secondary | ICD-10-CM | POA: Insufficient documentation

## 2023-10-14 NOTE — Progress Notes (Signed)
B12 is low.  Likely due to ulcerative colitis.  Recommend starting B12 injections.  Cholesterol is a little elevated.  Recommend starting statin to improve numbers and low risk heart attack and stroke.  Please send in Lipitor 40 mg daily if he is agreeable.  Regardless he should continue to work on diet and exercise and we can recheck in a year.  White blood cell count is mildly elevated but stable compared to his previous values.  This is also likely due to his ulcerative colitis.  We can recheck again at next office visit.  The rest of his labs are all stable.

## 2023-10-25 ENCOUNTER — Ambulatory Visit (INDEPENDENT_AMBULATORY_CARE_PROVIDER_SITE_OTHER): Payer: Medicare HMO

## 2023-11-28 ENCOUNTER — Other Ambulatory Visit: Payer: Self-pay | Admitting: Family Medicine

## 2023-11-28 DIAGNOSIS — R030 Elevated blood-pressure reading, without diagnosis of hypertension: Secondary | ICD-10-CM

## 2024-05-30 ENCOUNTER — Ambulatory Visit (INDEPENDENT_AMBULATORY_CARE_PROVIDER_SITE_OTHER): Payer: Medicare HMO

## 2024-05-30 VITALS — Ht 63.0 in | Wt 146.0 lb

## 2024-05-30 DIAGNOSIS — Z0000 Encounter for general adult medical examination without abnormal findings: Secondary | ICD-10-CM

## 2024-05-30 NOTE — Progress Notes (Signed)
 Subjective:   Richard Grant is a 73 y.o. who presents for a Medicare Wellness preventive visit.  As a reminder, Annual Wellness Visits don't include a physical exam, and some assessments may be limited, especially if this visit is performed virtually. We may recommend an in-person follow-up visit with your provider if needed.  Visit Complete: Virtual I connected with  Richard Grant on 05/30/24 by a audio enabled telemedicine application and verified that I am speaking with the correct person using two identifiers.  Patient Location: Home  Provider Location: Office/Clinic  I discussed the limitations of evaluation and management by telemedicine. The patient expressed understanding and agreed to proceed.  Vital Signs: Because this visit was a virtual/telehealth visit, some criteria may be missing or patient reported. Any vitals not documented were not able to be obtained and vitals that have been documented are patient reported.  VideoDeclined- This patient declined Librarian, academic. Therefore the visit was completed with audio only.  Persons Participating in Visit: Patient.  AWV Questionnaire: Yes: Patient Medicare AWV questionnaire was completed by the patient on 05/29/24; I have confirmed that all information answered by patient is correct and no changes since this date.  Cardiac Risk Factors include: advanced age (>79men, >46 women);dyslipidemia;hypertension;male gender     Objective:    Today's Vitals   05/29/24 1608 05/30/24 1304  Weight:  146 lb (66.2 kg)  Height:  5' 3 (1.6 m)  PainSc: 5     Body mass index is 25.86 kg/m.     05/30/2024    1:07 PM 05/25/2023   11:37 AM 06/05/2022    8:41 AM 05/30/2021    9:04 AM  Advanced Directives  Does Patient Have a Medical Advance Directive? Yes Yes Yes Yes  Type of Estate agent of [place];Living will Healthcare Power of [place];Living will Healthcare Power of Attorney Living will   Copy of Healthcare Power of Attorney in Chart? No - copy requested No - copy requested No - copy requested     Current Medications (verified) Outpatient Encounter Medications as of 05/30/2024  Medication Sig   Blood Pressure Monitoring KIT Use daily as needed to check blood pressure.   hydrochlorothiazide (HYDRODIURIL) 25 MG tablet TAKE 1 TABLET BY MOUTH EVERY DAY   metoprolol  succinate (TOPROL -XL) 50 MG 24 hr tablet Take 1 tablet (50 mg total) by mouth daily. TAKE WITH OR IMMEDIATELY FOLLOWING A MEAL.   Milk Thistle 500 MG CAPS Take 1 capsule by mouth daily.   MULTIPLE VITAMIN PO Take 1 capsule by mouth daily.   omeprazole (PRILOSEC OTC) 20 MG tablet Take 20 mg by mouth daily.   OVER THE COUNTER MEDICATION Iron 65mg  one daily   Saw Palmetto, Serenoa repens, (SAW PALMETTO PO) Take 1 capsule by mouth daily.   [DISCONTINUED] AMBULATORY NON FORMULARY MEDICATION Medication Name: Budesonide  5mg  capsules Sig: Take 2 capsules daily   [DISCONTINUED] pantoprazole  (PROTONIX ) 20 MG tablet TAKE 1 TABLET BY MOUTH EVERY DAY BEFORE BREAKFAST   [DISCONTINUED] SHINGRIX injection  (Patient not taking: Reported on 10/11/2023)   [DISCONTINUED] SPIKEVAX injection  (Patient not taking: Reported on 10/11/2023)   No facility-administered encounter medications on file as of 05/30/2024.    Allergies (verified) Lactose intolerance (gi) and Latex   History: Past Medical History:  Diagnosis Date   Arthritis    B12 deficiency 11/28/2021   Barrett's esophagus    GERD (gastroesophageal reflux disease)    Hypertension    Ileitis    backwash  Iron deficiency 11/28/2021   Ulcerative pancolitis [facility])    Past Surgical History:  Procedure Laterality Date   COLONOSCOPY  03/26/2021   ESOPHAGOGASTRODUODENOSCOPY  03/26/2021   HERNIA REPAIR  2012   Family History  Problem Relation Age of Onset   Arthritis Mother    Hypertension Mother    Diabetes Father    Early death Father    Hypertension Father    Drug abuse  Sister    Mental illness Sister    Leukemia Maternal Grandfather    Liver cancer Paternal Grandfather    Colon cancer Neg Hx    Esophageal cancer Neg Hx    Stomach cancer Neg Hx    Pancreatic cancer Neg Hx    Social History   Socioeconomic History   Marital status: Media planner    Spouse name: Not on file   Number of children: Not on file   Years of education: Not on file   Highest education level: 12th grade  Occupational History   Not on file  Tobacco Use   Smoking status: Never   Smokeless tobacco: Never  Vaping Use   Vaping status: Never Used  Substance and Sexual Activity   Alcohol use: Yes    Alcohol/week: 3.0 standard drinks of alcohol    Types: 3 Glasses of wine per week    Comment: DAILY    Drug use: Never   Sexual activity: Not on file  Other Topics Concern   Not on file  Social History Narrative   Retired - Psychologist, sport and exercise x 50 yrs   [place] from [place]: Ornithology   Lives with life partner   No kids   3 glasses wine/day   Never smoker, no drugs   Social Drivers of Corporate investment banker Strain: Low Risk  (05/29/2024)   Overall Financial Resource Strain (CARDIA)    Difficulty of Paying Living Expenses: Not hard at all  Food Insecurity: No Food Insecurity (05/29/2024)   Hunger Vital Sign    Worried About Running Out of Food in the Last Year: Never true    Ran Out of Food in the Last Year: Never true  Transportation Needs: No Transportation Needs (05/29/2024)   PRAPARE - Administrator, Civil Service (Medical): No    Lack of Transportation (Non-Medical): No  Physical Activity: Insufficiently Active (05/29/2024)   Exercise Vital Sign    Days of Exercise per Week: 3 days    Minutes of Exercise per Session: 40 min  Stress: Stress Concern Present (05/29/2024)   [employer] of Occupational Health - Occupational Stress Questionnaire    Feeling of Stress: To some extent  Social Connections: Moderately Isolated  (05/29/2024)   Social Connection and Isolation Panel    Frequency of Communication with Friends and Family: Twice a week    Frequency of Social Gatherings with Friends and Family: Once a week    Attends Religious Services: Never    Database administrator or Organizations: No    Attends Engineer, structural: Not on file    Marital Status: Living with partner    Tobacco Counseling Counseling given: Not Answered    Clinical Intake:  Pre-visit preparation completed: Yes  Pain : 0-10 Pain Score: 5  Pain Type: Chronic pain Pain Location: Shoulder Pain Descriptors / Indicators: Aching Pain Onset: More than a month ago Pain Frequency: Constant     BMI - recorded: 25.86 Nutritional Status: BMI 25 -29 Overweight  Nutritional Risks: None Diabetes: No  Lab Results  Component Value Date   HGBA1C 5.4 10/11/2023     How often do you need to have someone help you when you read instructions, pamphlets, or other written materials from your doctor or pharmacy?: 1 - Never  Interpreter Needed?: No  Information entered by :: Ellouise Haws, LPN   Activities of Daily Living     05/29/2024    4:08 PM  In your present state of health, do you have any difficulty performing the following activities:  Hearing? 0  Vision? 0  Difficulty concentrating or making decisions? 0  Walking or climbing stairs? 0  Dressing or bathing? 0  Doing errands, shopping? 0  Preparing Food and eating ? N  Using the Toilet? N  In the past six months, have you accidently leaked urine? N  Do you have problems with loss of bowel control? N  Managing your Medications? N  Managing your Finances? N  Housekeeping or managing your Housekeeping? N    Patient Care Team: Kennyth Worth HERO, MD as PCP - General (Family Medicine)  I have updated your Care Teams any recent Medical Services you may have received from other providers in the past year.     Assessment:   This is a routine wellness examination  for [place].  Hearing/Vision screen Hearing Screening - Comments:: Pt denies any hearing issues  Vision Screening - Comments:: Encourage to follow up with provider    Goals Addressed             This Visit's Progress    Patient Stated       Keep active and healthy        Depression Screen     05/30/2024    1:09 PM 05/25/2023   11:37 AM 06/05/2022    8:40 AM 08/21/2021    8:04 AM 05/30/2021    9:02 AM 12/05/2020   11:37 AM  PHQ 2/9 Scores  PHQ - 2 Score 0 1 0 0 0 0    Fall Risk     05/29/2024    4:08 PM 05/25/2023    9:07 AM 06/05/2022    8:42 AM 08/21/2021    8:04 AM 05/30/2021    9:06 AM  Fall Risk   Falls in the past year? 0 0 0 0 0  Number falls in past yr: 0 0 0 0 0  Injury with Fall? 0 0 0 0 0  Risk for fall due to : No Fall Risks Impaired vision Impaired vision History of fall(s) Impaired vision  Follow up Falls prevention discussed Falls prevention discussed Falls prevention discussed  Education provided  Falls prevention discussed      Data saved with a previous flowsheet row definition    MEDICARE RISK AT HOME:  Medicare Risk at Home Any stairs in or around the home?: (Patient-Rptd) Yes If so, are there any without handrails?: (Patient-Rptd) No Home free of loose throw rugs in walkways, pet beds, electrical cords, etc?: (Patient-Rptd) Yes Adequate lighting in your home to reduce risk of falls?: (Patient-Rptd) Yes Life alert?: (Patient-Rptd) No Use of a cane, walker or w/c?: (Patient-Rptd) No Grab bars in the bathroom?: (Patient-Rptd) Yes Shower chair or bench in shower?: (Patient-Rptd) Yes Elevated toilet seat or a handicapped toilet?: (Patient-Rptd) No  TIMED UP AND GO:  Was the test performed?  No  Cognitive Function: 6CIT completed        05/30/2024    1:11 PM 05/25/2023   11:40  AM 06/05/2022    8:42 AM 05/30/2021    9:08 AM  6CIT Screen  What Year? 0 points 0 points 0 points 0 points  What month? 0 points 0 points 0 points 0 points  What time? 0  points 0 points 0 points 0 points  Count back from 20 0 points 0 points 0 points 0 points  Months in reverse 0 points 0 points 0 points 0 points  Repeat phrase 0 points 0 points 0 points 0 points  Total Score 0 points 0 points 0 points 0 points    Immunizations Immunization History  Administered Date(s) Administered   Fluad Quad(high Dose 65+) 07/10/2022   Influenza, High Dose Seasonal PF 07/24/2021, 07/15/2023   Influenza-Unspecified 07/26/2020   Moderna Covid-19 Fall Seasonal Vaccine 47yrs & older 01/07/2023   Moderna Covid-19 Vaccine Bivalent Booster 99yrs & up 08/05/2021   Moderna Sars-Covid-2 Vaccination 01/10/2020, 02/08/2020, 08/28/2020, 02/12/2021   PNEUMOCOCCAL CONJUGATE-20 04/05/2021   Pfizer(Comirnaty)Fall Seasonal Vaccine 12 years and older 07/30/2022, 07/15/2023   Respiratory Syncytial Virus Vaccine,Recomb Aduvanted(Arexvy) 11/10/2022   Tdap 02/17/2021   Zoster Recombinant(Shingrix) 10/11/2022, 12/14/2022    Screening Tests Health Maintenance  Topic Date Due   Colonoscopy  03/26/2022   COVID-19 Vaccine (9 - Moderna risk 2024-25 season) 01/12/2024   INFLUENZA VACCINE  05/26/2024   Medicare Annual Wellness (AWV)  05/30/2025   DTaP/Tdap/Td (2 - Td or Tdap) 02/18/2031   Pneumococcal Vaccine: 50+ Years  Completed   Hepatitis C Screening  Completed   Zoster Vaccines- Shingrix  Completed   Hepatitis B Vaccines  Aged Out   HPV VACCINES  Aged Out   Meningococcal B Vaccine  Aged Out    Health Maintenance  Health Maintenance Due  Topic Date Due   Colonoscopy  03/26/2022   COVID-19 Vaccine (9 - Moderna risk 2024-25 season) 01/12/2024   INFLUENZA VACCINE  05/26/2024   Health Maintenance Items Addressed: See Nurse Notes at the end of this note  Additional Screening:  Vision Screening: Recommended annual ophthalmology exams for early detection of glaucoma and other disorders of the eye. Would you like a referral to an eye doctor? No    Dental Screening:  Recommended annual dental exams for proper oral hygiene  Community Resource Referral / Chronic Care Management: CRR required this visit?  No   CCM required this visit?  No   Plan:    I have personally reviewed and noted the following in the patient's chart:   Medical and social history Use of alcohol, tobacco or illicit drugs  Current medications and supplements including opioid prescriptions. Patient is not currently taking opioid prescriptions. Functional ability and status Nutritional status Physical activity Advanced directives List of other physicians Hospitalizations, surgeries, and ER visits in previous 12 months Vitals Screenings to include cognitive, depression, and falls Referrals and appointments  In addition, I have reviewed and discussed with patient certain preventive protocols, quality metrics, and best practice recommendations. A written personalized care plan for preventive services as well as general preventive health recommendations were provided to patient.   Ellouise VEAR Haws, LPN   10/29/7972   After Visit Summary: (MyChart) Due to this being a telephonic visit, the after visit summary with patients personalized plan was offered to patient via MyChart   Notes: Nothing significant to report at this time. Will discuss colonoscopy at appt on 06/06/24

## 2024-05-30 NOTE — Patient Instructions (Signed)
 Mr. Haro , Thank you for taking time out of your busy schedule to complete your Annual Wellness Visit with me. I enjoyed our conversation and look forward to speaking with you again next year. I, as well as your care team,  appreciate your ongoing commitment to your health goals. Please review the following plan we discussed and let me know if I can assist you in the future. Your Game plan/ To Do List    Referrals: If you haven't heard from the office you've been referred to, please reach out to them at the phone provided.   Follow up Visits: We will see or speak with you next year for your Next Medicare AWV with our clinical staff Have you seen your provider in the last 6 months (3 months if uncontrolled diabetes)? No  Clinician Recommendations:  Aim for 30 minutes of exercise or brisk walking, 6-8 glasses of water, and 5 servings of fruits and vegetables each day.       This is a list of the screenings recommended for you:  Health Maintenance  Topic Date Due   Colon Cancer Screening  03/26/2022   COVID-19 Vaccine (9 - Moderna risk 2024-25 season) 01/12/2024   Flu Shot  05/26/2024   Medicare Annual Wellness Visit  05/30/2025   DTaP/Tdap/Td vaccine (2 - Td or Tdap) 02/18/2031   Pneumococcal Vaccine for age over 51  Completed   Hepatitis C Screening  Completed   Zoster (Shingles) Vaccine  Completed   Hepatitis B Vaccine  Aged Out   HPV Vaccine  Aged Out   Meningitis B Vaccine  Aged Out    Advanced directives: (Copy Requested) Please bring a copy of your health care power of attorney and living will to the office to be added to your chart at your convenience. You can mail to [facility] [address] [place], [place] [address] or email to [email or web address] Advance Care Planning is important because it:  [x]  Makes sure you receive the medical care that is consistent with your values, goals, and preferences  [x]  It provides guidance to your family and loved  ones and reduces their decisional burden about whether or not they are making the right decisions based on your wishes.  Follow the link provided in your after visit summary or read over the paperwork we have mailed to you to help you started getting your Advance Directives in place. If you need assistance in completing these, please reach out to us  so that we can help you!  See attachments for Preventive Care and Fall Prevention Tips.

## 2024-06-06 ENCOUNTER — Ambulatory Visit (INDEPENDENT_AMBULATORY_CARE_PROVIDER_SITE_OTHER): Admitting: Family Medicine

## 2024-06-06 ENCOUNTER — Encounter: Payer: Self-pay | Admitting: Family Medicine

## 2024-06-06 VITALS — BP 156/84 | HR 83 | Temp 97.9°F | Ht 63.0 in | Wt 145.4 lb

## 2024-06-06 DIAGNOSIS — K51018 Ulcerative (chronic) pancolitis with other complication: Secondary | ICD-10-CM | POA: Diagnosis not present

## 2024-06-06 DIAGNOSIS — K2270 Barrett's esophagus without dysplasia: Secondary | ICD-10-CM

## 2024-06-06 DIAGNOSIS — I10 Essential (primary) hypertension: Secondary | ICD-10-CM

## 2024-06-06 DIAGNOSIS — M2550 Pain in unspecified joint: Secondary | ICD-10-CM

## 2024-06-06 NOTE — Progress Notes (Signed)
   Richard Grant is a 73 y.o. male who presents today for an office visit.  Assessment/Plan:  Chronic Problems Addressed Today: Barrett's esophagus Follows with GI.  He is having more dyspepsia.  He is on PPI.  Would like to be referred for second opinion. Will place referral to [facility] gastroenterology.   Ulcerative colitis (HCC) Overall symptoms are stable.  He is no longer on budesonide .  Appears a follow-up with GI but would like referral for second opinion as above.  Will place referral today.  Polyarthralgia Patient still having quite a bit of pain in the shoulder.  Also having more carpal tunnel symptoms as well.  We have discussed this in the past and he is interested in referral for potential cortisone injections.  Will place referral today.  Essential hypertension Slightly above goal.  Likely does have some element of whitecoat hypertension.  He has not been routinely monitoring his blood pressure at home.  Will continue his current regimen HCTZ 25 mg daily and metoprolol  succinate 50 mg daily.  He will monitor at home and let us  know if persistently elevated.  Recheck in 3 to 6 months.     Subjective:  HPI:  See assessment / plan for status of chronic conditions.  Discussed the use of AI scribe software for clinical note transcription with the patient, who gave verbal consent to proceed.  History of Present Illness Richard Grant is a 73 year old male with Barrett's esophagus and ulcerative colitis who presents for a referral to a for second opinion due to his Barrett's esophagus/GERD.  He experiences postprandial chest distress, described as a sensation of discomfort rather than pain, which has persisted for several months. This occurs regardless of meal size or speed. Belching almost completely alleviates the discomfort, and he drinks a can of seltzer or club soda with every meal to induce burping and relieve symptoms.  In June 2022, he underwent an endoscopy and colonoscopy,  resulting in a diagnosis of Barrett's esophagus. During the colonoscopy, a diminutive sessile polyp was removed from the rectum, and he was informed that everything else was normal. He feels that his current symptoms do not align with typical Barrett's esophagus symptoms.  He has a history of ulcerative colitis and was previously on budesonide , which effectively managed his symptoms. However, he has not renewed his prescription due to feeling better and not pursuing a follow-up visit.  He suffers from arthritis and carpal tunnel syndrome, with significant wrist pain. Carpal tunnel syndrome has been present since the 1980s, likely due to his previous occupation. The condition improved when he stopped working but has recently become more painful.  He has not been monitoring his blood pressure at home but acknowledges a history of 'white coat syndrome.' No changes in bowel habits or significant weight loss.         Objective:  Physical Exam: BP (!) 156/84   Pulse 83   Temp 97.9 F (36.6 C) (Temporal)   Ht 5' 3 (1.6 m)   Wt 145 lb 6.4 oz (66 kg)   SpO2 98%   BMI 25.76 kg/m   Gen: No acute distress, resting comfortably CV: Regular rate and rhythm with no murmurs appreciated Pulm: Normal work of breathing, clear to auscultation bilaterally with no crackles, wheezes, or rhonchi Neuro: Grossly normal, moves all extremities Psych: Normal affect and thought content      Edgardo Petrenko M. Kennyth, MD 06/06/2024 11:56 AM

## 2024-06-06 NOTE — Assessment & Plan Note (Signed)
 Slightly above goal.  Likely does have some element of whitecoat hypertension.  He has not been routinely monitoring his blood pressure at home.  Will continue his current regimen HCTZ 25 mg daily and metoprolol  succinate 50 mg daily.  He will monitor at home and let us  know if persistently elevated.  Recheck in 3 to 6 months.

## 2024-06-06 NOTE — Patient Instructions (Signed)
 It was very nice to see you today!  VISIT SUMMARY: You visited us  today to discuss your ongoing post-meal chest discomfort and to get referrals for a new gastroenterologist and treatment for your carpal tunnel syndrome and arthritis.  YOUR PLAN: BARRETT'S ESOPHAGUS: You have been experiencing chest discomfort after meals, which is not typical for Barrett's esophagus. -We will refer you to [facility] for a gastroenterology consultation.  ULCERATIVE COLITIS (PANCOLITIS): Your ulcerative colitis was previously managed with budesonide , but you are currently not on any treatment. -We will refer you to [facility] for a gastroenterology consultation to manage your condition.  OSTEOARTHRITIS: You have osteoarthritis and are considering cortisone injections for relief. -We will refer you to sports medicine for a cortisone injection.  ESSENTIAL HYPERTENSION: You have elevated blood pressure in the office, possibly due to white coat syndrome. -We recommend that you start monitoring your blood pressure at home.  Return in about 6 months (around 12/07/2024) for Annual Physical.   Take care, Dr Kennyth  PLEASE NOTE:  If you had any lab tests, please let us  know if you have not heard back within a few days. You may see your results on mychart before we have a chance to review them but we will give you a call once they are reviewed by us .   If we ordered any referrals today, please let us  know if you have not heard from their office within the next week.   If you had any urgent prescriptions sent in today, please check with the pharmacy within an hour of our visit to make sure the prescription was transmitted appropriately.   Please try these tips to maintain a healthy lifestyle:  Eat at least 3 REAL meals and 1-2 snacks per day.  Aim for no more than 5 hours between eating.  If you eat breakfast, please do so within one hour of getting up.   Each meal should contain half fruits/vegetables, one quarter  protein, and one quarter carbs (no bigger than a computer mouse)  Cut down on sweet beverages. This includes juice, soda, and sweet tea.   Drink at least 1 glass of water with each meal and aim for at least 8 glasses per day  Exercise at least 150 minutes every week.

## 2024-06-06 NOTE — Assessment & Plan Note (Signed)
 Patient still having quite a bit of pain in the shoulder.  Also having more carpal tunnel symptoms as well.  We have discussed this in the past and he is interested in referral for potential cortisone injections.  Will place referral today.

## 2024-06-06 NOTE — Assessment & Plan Note (Signed)
 Overall symptoms are stable.  He is no longer on budesonide .  Appears a follow-up with GI but would like referral for second opinion as above.  Will place referral today.

## 2024-06-06 NOTE — Assessment & Plan Note (Signed)
 Follows with GI.  He is having more dyspepsia.  He is on PPI.  Would like to be referred for second opinion. Will place referral to [facility] gastroenterology.

## 2024-06-09 ENCOUNTER — Telehealth: Payer: Self-pay | Admitting: *Deleted

## 2024-06-09 NOTE — Telephone Encounter (Signed)
 Send information to referral team Patient requesting [facility]

## 2024-06-23 NOTE — Progress Notes (Signed)
 Ben Rhyder Bratz D.[facility] [address] [place] [address] Phone: [phone]   Assessment and Plan:     1. Primary osteoarthritis of right shoulder (Primary) 2. Chronic right shoulder pain -Chronic with exacerbation, initial visit - Consistent with flare of severe glenohumeral osteoarthritis - X-ray obtained in clinic.  My interpretation: No acute fracture or dislocation.  Severe glenohumeral degenerative changes with decreased joint space, bone spurring.  Moderate to severe AC joint degenerative changes. - Start meloxicam  15 mg daily x2 weeks.  If still having pain after 2 weeks, complete 3rd-week of NSAID. May use remaining NSAID as needed once daily for pain control.  Do not to use additional over-the-counter NSAIDs (ibuprofen, naproxen, Advil, Aleve, etc.) while taking prescription NSAIDs.  May use Tylenol [phone] mg 2 to 3 times a day for breakthrough pain.  Do not recommend prolonged NSAID use with history of UC -Start HEP for shoulder - Discussed with patient that he is unlikely to regain full range of motion in right shoulder due to degree of osteoarthritis without surgical intervention.  Patient is interested in trying conservative therapy first.  3. Primary osteoarthritis of first carpometacarpal joint of left hand 4. Primary osteoarthritis of right hand 5. Bilateral wrist pain -Chronic with exacerbation, initial visit - Bilateral wrist and hand pain most consistent with left-sided CMC osteoarthritis and right sided sphenoid-trapezium osteoarthritis.  Physical exam and HPI not classic for carpal tunnel, though there could be a degree contributing to patient's symptoms. - X-ray obtained in clinic.  My interpretation: No acute fracture or dislocation.  Severe degenerative changes at left [facility] joint and severe degenerative changes at right sphenoid-trapezium joint - Start meloxicam  15 mg daily x2 weeks.  If still having pain after 2 weeks,  complete 3rd-week of NSAID. May use remaining NSAID as needed once daily for pain control.  Do not to use additional over-the-counter NSAIDs (ibuprofen, naproxen, Advil, Aleve, etc.) while taking prescription NSAIDs.  May use Tylenol [phone] mg 2 to 3 times a day for breakthrough pain. - Start HEP for hands  15 additional minutes spent for educating Therapeutic Home Exercise Program.  This included exercises focusing on stretching, strengthening, with focus on eccentric aspects.   Long term goals include an improvement in range of motion, strength, endurance as well as avoiding reinjury. Patient's frequency would include in 1-2 times a day, 3-5 times a week for a duration of 6-12 weeks. Proper technique shown and discussed handout in great detail with ATC.  All questions were discussed and answered.      Pertinent previous records reviewed include none   Follow Up: 4 weeks for reevaluation.  If no improvement or worsening of symptoms, could consider CSI to intra-articular right shoulder versus subacromial CSI versus had CSI   Subjective:   I, Richard Grant, am serving as a Neurosurgeon for Doctor Morene Mace  Chief Complaint: bilat wrist and right shoulder pain   HPI:   06/27/2024 Patient is a 73 year old male with bilat wrist and right shoulder pain. Patient states wrist pain since the 80s thinks its carpal tunnel.  Pain radiates up the arm at night. Does endorse numbness and tingling. Asprin intermittent helps a little. Does wear braces and those help a little. Decreased ROM. Decreased grip strength  R shoulder pain for years. Decreased ROM. Hx of arthritis. Bilat shoulder pain R is worse. No radiating pain. No numbness or tingling. Icy hot does something for the pain.  Relevant Historical Information: GERD, hypertension, history of ulcerative colitis  Additional pertinent review of systems negative.   Current Outpatient Medications:    meloxicam  (MOBIC ) 15 MG tablet, Take 1 tablet  (15 mg total) by mouth daily., Disp: 30 tablet, Rfl: 0   hydrochlorothiazide (HYDRODIURIL) 25 MG tablet, TAKE 1 TABLET BY MOUTH EVERY DAY, Disp: 90 tablet, Rfl: 3   metoprolol  succinate (TOPROL -XL) 50 MG 24 hr tablet, Take 1 tablet (50 mg total) by mouth daily. TAKE WITH OR IMMEDIATELY FOLLOWING A MEAL., Disp: 90 tablet, Rfl: 3   Milk Thistle 500 MG CAPS, Take 1 capsule by mouth daily., Disp: , Rfl:    MULTIPLE VITAMIN PO, Take 1 capsule by mouth daily., Disp: , Rfl:    omeprazole (PRILOSEC OTC) 20 MG tablet, Take 20 mg by mouth daily., Disp: , Rfl:    OVER THE COUNTER MEDICATION, Iron 65mg  one daily, Disp: , Rfl:    Saw Palmetto, Serenoa repens, (SAW PALMETTO PO), Take 1 capsule by mouth daily., Disp: , Rfl:    Objective:     Vitals:   06/27/24 1121  Pulse: 72  SpO2: 98%  Weight: 145 lb (65.8 kg)  Height: 5' 3 (1.6 m)      Body mass index is 25.69 kg/m.    Physical Exam:    Gen: Appears well, nad, nontoxic and pleasant Neuro:sensation intact, strength is 5/5 with df/pf/inv/ev, muscle tone wnl Skin: no suspicious lesion or defmority Psych: A&O, appropriate mood and affect  Right shoulder:  No deformity, swelling or muscle wasting No scapular winging FF  80, abd  80, int 30, ext 60 NTTP over the [place], clavicle, ac, coracoid, biceps groove, humerus, deltoid, trapezius, cervical spine Limited special testing due to limited ROM Negative Spurling's test bilat  General: Appears well, nad, nontoxic and pleasant Neuro:sensation intact, strength is 5/5 in upper extremities, muscle tone wnl Skin:no susupicious lesions or rashes  Bilateral hand/Wrist:   No deformity or swelling appreciated. ROM  Ext 90, flexion 70, radial/ulnar deviation 30 TTP bilateral CMC joint nttp over the snuff box, dorsal carpals, volar carpals, radial styloid, ulnar styloid, 1st mcp, tfcc Negative Tinel's, Phalen's, Prayer Tests Negative finklestein Neg tfcc bounce test   pain with resisted ext, flex or  deviation    Electronically signed by:  Odis Mace D.[facility] 11:55 AM 06/27/24

## 2024-06-27 ENCOUNTER — Ambulatory Visit: Admitting: Sports Medicine

## 2024-06-27 ENCOUNTER — Ambulatory Visit (INDEPENDENT_AMBULATORY_CARE_PROVIDER_SITE_OTHER)

## 2024-06-27 VITALS — HR 72 | Ht 63.0 in | Wt 145.0 lb

## 2024-06-27 DIAGNOSIS — M25532 Pain in left wrist: Secondary | ICD-10-CM | POA: Diagnosis not present

## 2024-06-27 DIAGNOSIS — M19032 Primary osteoarthritis, left wrist: Secondary | ICD-10-CM | POA: Diagnosis not present

## 2024-06-27 DIAGNOSIS — G8929 Other chronic pain: Secondary | ICD-10-CM

## 2024-06-27 DIAGNOSIS — M25511 Pain in right shoulder: Secondary | ICD-10-CM

## 2024-06-27 DIAGNOSIS — M19041 Primary osteoarthritis, right hand: Secondary | ICD-10-CM

## 2024-06-27 DIAGNOSIS — M19011 Primary osteoarthritis, right shoulder: Secondary | ICD-10-CM | POA: Diagnosis not present

## 2024-06-27 DIAGNOSIS — M1812 Unilateral primary osteoarthritis of first carpometacarpal joint, left hand: Secondary | ICD-10-CM | POA: Diagnosis not present

## 2024-06-27 DIAGNOSIS — M25711 Osteophyte, right shoulder: Secondary | ICD-10-CM | POA: Diagnosis not present

## 2024-06-27 DIAGNOSIS — M25531 Pain in right wrist: Secondary | ICD-10-CM

## 2024-06-27 DIAGNOSIS — M19031 Primary osteoarthritis, right wrist: Secondary | ICD-10-CM | POA: Diagnosis not present

## 2024-06-27 MED ORDER — MELOXICAM 15 MG PO TABS: 15.0000 mg | ORAL_TABLET | Freq: Every day | ORAL | 0 refills | Status: AC

## 2024-06-27 NOTE — Patient Instructions (Addendum)
 Shoulder and wrist HEP   - Start meloxicam  15 mg daily x2 weeks.  If still having pain after 2 weeks, complete 3rd-week of NSAID. May use remaining NSAID as needed once daily for pain control.  Do not to use additional over-the-counter NSAIDs (ibuprofen, naproxen, Advil, Aleve, etc.) while taking prescription NSAIDs.  May use Tylenol [phone] mg 2 to 3 times a day for breakthrough pain.  4 week follow up

## 2024-07-06 ENCOUNTER — Ambulatory Visit: Payer: Self-pay | Admitting: Sports Medicine

## 2024-07-17 DIAGNOSIS — Z23 Encounter for immunization: Secondary | ICD-10-CM | POA: Diagnosis not present

## 2024-10-23 ENCOUNTER — Other Ambulatory Visit: Payer: Self-pay | Admitting: Family Medicine

## 2024-10-23 DIAGNOSIS — R030 Elevated blood-pressure reading, without diagnosis of hypertension: Secondary | ICD-10-CM

## 2025-06-04 ENCOUNTER — Ambulatory Visit
